# Patient Record
Sex: Male | Born: 1960 | Smoking: Current every day smoker
Health system: Southern US, Community
[De-identification: ages and names within clinical notes are randomized; demographics above are authoritative.]

## PROBLEM LIST (undated history)

## (undated) DIAGNOSIS — F1010 Alcohol abuse, uncomplicated: Secondary | ICD-10-CM

## (undated) HISTORY — PX: APPENDECTOMY: SHX54

---

## 2019-01-16 ENCOUNTER — Inpatient Hospital Stay
Admission: EM | Admit: 2019-01-16 | Discharge: 2019-01-18 | DRG: 101 | Disposition: A | Discharge status: Home / Self Care | Payer: Self-pay | Attending: Family Medicine | Admitting: Family Medicine

## 2019-01-16 ENCOUNTER — Other Ambulatory Visit: Payer: Self-pay

## 2019-01-16 ENCOUNTER — Encounter: Payer: Self-pay | Admitting: Emergency Medicine

## 2019-01-16 ENCOUNTER — Emergency Department: Payer: Self-pay

## 2019-01-16 DIAGNOSIS — M6282 Rhabdomyolysis: Secondary | ICD-10-CM | POA: Diagnosis present

## 2019-01-16 DIAGNOSIS — N179 Acute kidney failure, unspecified: Secondary | ICD-10-CM | POA: Diagnosis present

## 2019-01-16 DIAGNOSIS — K709 Alcoholic liver disease, unspecified: Secondary | ICD-10-CM | POA: Diagnosis present

## 2019-01-16 DIAGNOSIS — R569 Unspecified convulsions: Principal | ICD-10-CM | POA: Diagnosis present

## 2019-01-16 DIAGNOSIS — F17210 Nicotine dependence, cigarettes, uncomplicated: Secondary | ICD-10-CM | POA: Diagnosis present

## 2019-01-16 DIAGNOSIS — F10239 Alcohol dependence with withdrawal, unspecified: Secondary | ICD-10-CM | POA: Diagnosis present

## 2019-01-16 DIAGNOSIS — E8729 Other acidosis: Secondary | ICD-10-CM

## 2019-01-16 DIAGNOSIS — Z1159 Encounter for screening for other viral diseases: Secondary | ICD-10-CM

## 2019-01-16 DIAGNOSIS — D6959 Other secondary thrombocytopenia: Secondary | ICD-10-CM | POA: Diagnosis present

## 2019-01-16 DIAGNOSIS — F10232 Alcohol dependence with withdrawal with perceptual disturbance: Secondary | ICD-10-CM

## 2019-01-16 DIAGNOSIS — E876 Hypokalemia: Secondary | ICD-10-CM | POA: Diagnosis present

## 2019-01-16 DIAGNOSIS — Z6830 Body mass index (BMI) 30.0-30.9, adult: Secondary | ICD-10-CM

## 2019-01-16 DIAGNOSIS — E872 Acidosis: Secondary | ICD-10-CM | POA: Diagnosis present

## 2019-01-16 DIAGNOSIS — E669 Obesity, unspecified: Secondary | ICD-10-CM | POA: Diagnosis present

## 2019-01-16 HISTORY — DX: Alcohol abuse, uncomplicated: F10.10

## 2019-01-16 LAB — MAGNESIUM: Magnesium: 2.1 mg/dL (ref 1.7–2.4)

## 2019-01-16 LAB — COMPREHENSIVE METABOLIC PANEL
ALT: 113 U/L — ABNORMAL HIGH (ref 0–44)
AST: 176 U/L — ABNORMAL HIGH (ref 15–41)
Albumin: 4.1 g/dL (ref 3.5–5.0)
Alkaline Phosphatase: 117 U/L (ref 38–126)
Anion gap: 23 — ABNORMAL HIGH (ref 5–15)
BUN: 14 mg/dL (ref 6–20)
CO2: 12 mmol/L — ABNORMAL LOW (ref 22–32)
Calcium: 9 mg/dL (ref 8.9–10.3)
Chloride: 100 mmol/L (ref 98–111)
Creatinine, Ser: 1.56 mg/dL — ABNORMAL HIGH (ref 0.61–1.24)
GFR calc Af Amer: 57 mL/min — ABNORMAL LOW (ref >60)
GFR calc non Af Amer: 49 mL/min — ABNORMAL LOW (ref >60)
Glucose, Bld: 133 mg/dL — ABNORMAL HIGH (ref 70–99)
Potassium: 3.2 mmol/L — ABNORMAL LOW (ref 3.5–5.1)
Sodium: 135 mmol/L (ref 135–145)
Total Bilirubin: 4.3 mg/dL — ABNORMAL HIGH (ref 0.3–1.2)
Total Protein: 8 g/dL (ref 6.5–8.1)

## 2019-01-16 LAB — BLOOD GAS, VENOUS

## 2019-01-16 LAB — CBC
HCT: 41.7 % (ref 39.0–52.0)
Hemoglobin: 13.7 g/dL (ref 13.0–17.0)
MCH: 37 pg — ABNORMAL HIGH (ref 26.0–34.0)
MCHC: 32.9 g/dL (ref 30.0–36.0)
MCV: 112.7 fL — ABNORMAL HIGH (ref 80.0–100.0)
Platelets: 59 10*3/uL — ABNORMAL LOW (ref 150–400)
RBC: 3.7 MIL/uL — ABNORMAL LOW (ref 4.22–5.81)
RDW: 13.3 % (ref 11.5–15.5)
WBC: 16.3 10*3/uL — ABNORMAL HIGH (ref 4.0–10.5)
nRBC: 0 % (ref 0.0–0.2)

## 2019-01-16 LAB — LACTIC ACID, PLASMA: Lactic Acid, Venous: 1.5 mmol/L (ref 0.5–1.9)

## 2019-01-16 LAB — SARS CORONAVIRUS 2 BY RT PCR (HOSPITAL ORDER, PERFORMED IN ~~LOC~~ HOSPITAL LAB): SARS Coronavirus 2: NEGATIVE

## 2019-01-16 LAB — CK: Total CK: 1082 U/L — ABNORMAL HIGH (ref 49–397)

## 2019-01-16 MED ORDER — POTASSIUM CHLORIDE CRYS ER 20 MEQ PO TBCR
20.0000 meq | EXTENDED_RELEASE_TABLET | Freq: Two times a day (BID) | ORAL | Status: DC
  Administered 2019-01-16 – 2019-01-18 (×5): 20 meq via ORAL
  Filled 2019-01-16 (×5): qty 1

## 2019-01-16 MED ORDER — THIAMINE HCL 100 MG/ML IJ SOLN
Freq: Once | INTRAVENOUS | Status: AC
  Administered 2019-01-16: 12:00:00 via INTRAVENOUS
  Filled 2019-01-16: qty 1000

## 2019-01-16 MED ORDER — VITAMIN B-1 100 MG PO TABS
100.0000 mg | ORAL_TABLET | Freq: Every day | ORAL | Status: DC
  Administered 2019-01-16 – 2019-01-18 (×3): 100 mg via ORAL
  Filled 2019-01-16 (×3): qty 1

## 2019-01-16 MED ORDER — THIAMINE HCL 100 MG/ML IJ SOLN: 100.0000 mg | Freq: Every day | INTRAMUSCULAR | Status: DC

## 2019-01-16 MED ORDER — MAGNESIUM SULFATE IN D5W 1-5 GM/100ML-% IV SOLN
1.0000 g | Freq: Once | INTRAVENOUS | Status: DC
  Filled 2019-01-16: qty 100

## 2019-01-16 MED ORDER — DEXTROSE-NACL 5-0.9 % IV SOLN
INTRAVENOUS | Status: DC
  Administered 2019-01-16 – 2019-01-17 (×2): via INTRAVENOUS

## 2019-01-16 MED ORDER — NICOTINE 21 MG/24HR TD PT24
21.0000 mg | MEDICATED_PATCH | Freq: Every day | TRANSDERMAL | Status: DC
  Administered 2019-01-16 – 2019-01-17 (×2): 21 mg via TRANSDERMAL
  Filled 2019-01-16 (×2): qty 1

## 2019-01-16 MED ORDER — LORAZEPAM 2 MG/ML IJ SOLN
1.5000 mg | Freq: Once | INTRAMUSCULAR | Status: AC
  Administered 2019-01-16: 1.5 mg via INTRAVENOUS

## 2019-01-16 MED ORDER — ZOLPIDEM TARTRATE 5 MG PO TABS
5.0000 mg | ORAL_TABLET | Freq: Every evening | ORAL | Status: DC | PRN
  Administered 2019-01-16 – 2019-01-17 (×2): 5 mg via ORAL
  Filled 2019-01-16 (×2): qty 1

## 2019-01-16 MED ORDER — LORAZEPAM 2 MG/ML IJ SOLN
1.0000 mg | Freq: Once | INTRAMUSCULAR | Status: AC
  Administered 2019-01-16: 1 mg via INTRAVENOUS
  Filled 2019-01-16: qty 1

## 2019-01-16 MED ORDER — DOCUSATE SODIUM 100 MG PO CAPS: 100.0000 mg | ORAL_CAPSULE | Freq: Two times a day (BID) | ORAL | Status: DC | PRN

## 2019-01-16 MED ORDER — LORAZEPAM 1 MG PO TABS: 1.0000 mg | ORAL_TABLET | ORAL | Status: DC | PRN

## 2019-01-16 MED ORDER — LORAZEPAM 2 MG/ML IJ SOLN: 1.0000 mg | Freq: Four times a day (QID) | INTRAMUSCULAR | Status: DC | PRN

## 2019-01-16 MED ORDER — POTASSIUM CHLORIDE 10 MEQ/100ML IV SOLN
10.0000 meq | Freq: Once | INTRAVENOUS | Status: AC
  Administered 2019-01-16: 10 meq via INTRAVENOUS
  Filled 2019-01-16: qty 100

## 2019-01-16 MED ORDER — DEXTROSE-NACL 5-0.9 % IV SOLN
INTRAVENOUS | Status: DC
  Administered 2019-01-16: 14:00:00 via INTRAVENOUS

## 2019-01-16 MED ORDER — FOLIC ACID 1 MG PO TABS
1.0000 mg | ORAL_TABLET | Freq: Every day | ORAL | Status: DC
  Administered 2019-01-16 – 2019-01-18 (×3): 1 mg via ORAL
  Filled 2019-01-16 (×3): qty 1

## 2019-01-16 MED ORDER — HEPARIN SODIUM (PORCINE) 5000 UNIT/ML IJ SOLN
5000.0000 [IU] | Freq: Three times a day (TID) | INTRAMUSCULAR | Status: DC
  Administered 2019-01-16 – 2019-01-17 (×2): 5000 [IU] via SUBCUTANEOUS
  Filled 2019-01-16 (×2): qty 1

## 2019-01-16 MED ORDER — ADULT MULTIVITAMIN W/MINERALS CH
1.0000 | ORAL_TABLET | Freq: Every day | ORAL | Status: DC
  Administered 2019-01-16 – 2019-01-18 (×3): 1 via ORAL
  Filled 2019-01-16 (×3): qty 1

## 2019-01-16 MED ORDER — LORAZEPAM 2 MG/ML IJ SOLN
INTRAMUSCULAR | Status: AC
  Administered 2019-01-16: 11:00:00 1.5 mg via INTRAVENOUS
  Filled 2019-01-16: qty 1

## 2019-01-16 MED ORDER — LORAZEPAM 2 MG/ML IJ SOLN: 1.0000 mg | INTRAMUSCULAR | Status: DC | PRN

## 2019-01-16 MED ORDER — THIAMINE HCL 100 MG/ML IJ SOLN
100.0000 mg | Freq: Every day | INTRAMUSCULAR | Status: DC
  Filled 2019-01-16: qty 2

## 2019-01-16 MED ORDER — VITAMIN B-1 100 MG PO TABS
100.0000 mg | ORAL_TABLET | Freq: Every day | ORAL | Status: DC
  Administered 2019-01-16: 100 mg via ORAL
  Filled 2019-01-16: qty 1

## 2019-01-16 MED ORDER — LORAZEPAM 2 MG/ML IJ SOLN: 2.0000 mg | INTRAMUSCULAR | Status: DC | PRN

## 2019-01-16 MED ORDER — LORAZEPAM 1 MG PO TABS: 1.0000 mg | ORAL_TABLET | Freq: Four times a day (QID) | ORAL | Status: DC | PRN

## 2019-01-16 NOTE — H&P (Signed)
[employer] [facility] [place] [facility]   PATIENT NAME: Nicholas Durham    MR#:  [id number]  DATE OF BIRTH:  13-Jun-1961  DATE OF ADMISSION:  01/16/2019  PRIMARY CARE PHYSICIAN: System, Provider Not In   REQUESTING/REFERRING PHYSICIAN: Quale CHIEF COMPLAINT:   Chief Complaint  Patient presents with  . Seizures    HISTORY OF PRESENT ILLNESS: Nicholas Durham  is a 58 y.o. male with a known history of alcohol abuse, no known medical issues but he does not go to a physician regularly. Since he is staying home and not have much work for last 95-month due to COVID-19-he is drinking more alcohol every day.  Since last Friday (3 days ago) he decided he would not drink anymore and stopped.  Today at home he had one episode of seizures where his son saw him and brought to the emergency room.  While coming to ER he forgot his cell phone in the parking lot in car so went back and again had 2 more seizure episodes in the ER and parking lot. ER physician gave to admit to hospitalist service for alcohol withdrawal seizures.  Patient does not have history of seizures.  PAST MEDICAL HISTORY:   Past Medical History:  Diagnosis Date  . ETOH abuse     PAST SURGICAL HISTORY:  Past Surgical History:  Procedure Laterality Date  . APPENDECTOMY      SOCIAL HISTORY:  Social History   Tobacco Use  . Smoking status: Current Every Day Smoker    Packs/day: 0.50    Types: Cigarettes  . Smokeless tobacco: Never Used  Substance Use Topics  . Alcohol use: Yes    Comment: Stopped drinking 01/12/19    FAMILY HISTORY: History reviewed. No pertinent family history.  DRUG ALLERGIES: No Known Allergies  REVIEW OF SYSTEMS:   CONSTITUTIONAL: No fever, fatigue or weakness.  EYES: No blurred or double vision.  EARS, NOSE, AND THROAT: No tinnitus or ear pain.  RESPIRATORY: No cough, shortness of breath, wheezing or hemoptysis.  CARDIOVASCULAR: No chest pain, orthopnea, edema.  GASTROINTESTINAL: No  nausea, vomiting, diarrhea or abdominal pain.  GENITOURINARY: No dysuria, hematuria.  ENDOCRINE: No polyuria, nocturia,  HEMATOLOGY: No anemia, easy bruising or bleeding SKIN: No rash or lesion. MUSCULOSKELETAL: No joint pain or arthritis.   NEUROLOGIC: No tingling, numbness, weakness.  PSYCHIATRY: No anxiety or depression.   MEDICATIONS AT HOME:  Prior to Admission medications   Medication Sig Start Date End Date Taking? Authorizing Provider  Ibuprofen-diphenhydrAMINE HCl 200-25 MG CAPS Take 1-2 capsules by mouth at bedtime as needed (sleep).   Yes [provider]      PHYSICAL EXAMINATION:   VITAL SIGNS: Blood pressure (!) 150/89, pulse (!) 106, temperature 98.7 F (37.1 C), temperature source Oral, resp. rate 17, height  (1.854 m), weight 104.3 kg, SpO2 100 %.  GENERAL:  58 y.o.-year-old patient lying in the bed with no acute distress.  EYES: Pupils equal, round, reactive to light and accommodation. No scleral icterus. Extraocular muscles intact.  HEENT: Head atraumatic, normocephalic. Oropharynx and nasopharynx clear.  NECK:  Supple, no jugular venous distention. No thyroid enlargement, no tenderness.  LUNGS: Normal breath sounds bilaterally, no wheezing, rales,rhonchi or crepitation. No use of accessory muscles of respiration.  CARDIOVASCULAR: S1, S2 normal. No murmurs, rubs, or gallops.  ABDOMEN: Soft, nontender, nondistended. Bowel sounds present. No organomegaly or mass.  EXTREMITIES: No pedal edema, cyanosis, or clubbing.  Some bleeding from right elbow due to superficial injury.  NEUROLOGIC: Cranial nerves II through XII are intact. Muscle strength 5/5 in all extremities. Sensation intact. Gait not checked.  PSYCHIATRIC: The patient is alert and oriented x 3.  SKIN: No obvious rash, lesion, or ulcer.   LABORATORY PANEL:   CBC Recent Labs  Lab 01/16/19 1113  WBC 16.3*  HGB 13.7  HCT 41.7  PLT 59*  MCV 112.7*  MCH 37.0*  MCHC 32.9  RDW 13.3    ------------------------------------------------------------------------------------------------------------------  Chemistries  Recent Labs  Lab 01/16/19 1113 01/16/19 1356  NA 135  --   K 3.2*  --   CL 100  --   CO2 12*  --   GLUCOSE 133*  --   BUN 14  --   CREATININE 1.56*  --   CALCIUM 9.0  --   MG  --  2.1  AST 176*  --   ALT 113*  --   ALKPHOS 117  --   BILITOT 4.3*  --    ------------------------------------------------------------------------------------------------------------------ estimated creatinine clearance is 67.1 mL/min (A) (by C-G formula based on SCr of 1.56 mg/dL (H)). ------------------------------------------------------------------------------------------------------------------ No results for input(s): TSH, T4TOTAL, T3FREE, THYROIDAB in the last 72 hours.  Invalid input(s): FREET3   Coagulation profile No results for input(s): INR, PROTIME in the last 168 hours. ------------------------------------------------------------------------------------------------------------------- No results for input(s): DDIMER in the last 72 hours. -------------------------------------------------------------------------------------------------------------------  Cardiac Enzymes No results for input(s): CKMB, TROPONINI, MYOGLOBIN in the last 168 hours.  Invalid input(s): CK ------------------------------------------------------------------------------------------------------------------ Invalid input(s): POCBNP  ---------------------------------------------------------------------------------------------------------------  Urinalysis No results found for: COLORURINE, APPEARANCEUR, LABSPEC, PHURINE, GLUCOSEU, HGBUR, BILIRUBINUR, KETONESUR, PROTEINUR, UROBILINOGEN, NITRITE, LEUKOCYTESUR   RADIOLOGY: Ct Head Wo Contrast  Result Date: 01/16/2019 CLINICAL DATA:  Seizure today, fall and hit head. EXAM: CT HEAD WITHOUT CONTRAST CT CERVICAL SPINE WITHOUT CONTRAST  TECHNIQUE: Multidetector CT imaging of the head and cervical spine was performed following the standard protocol without intravenous contrast. Multiplanar CT image reconstructions of the cervical spine were also generated. COMPARISON:  None. FINDINGS: CT HEAD FINDINGS Brain: Mild generalized parenchymal volume loss with commensurate dilatation of the ventricles and sulci. Mild chronic small vessel ischemic changes within the deep periventricular white matter regions bilaterally. No mass, hemorrhage, edema or other evidence of acute parenchymal abnormality. No extra-axial hemorrhage. Vascular: No hyperdense vessel or unexpected calcification. Skull: Normal. Negative for fracture or focal lesion. Sinuses/Orbits: No acute finding. Other: None. CT CERVICAL SPINE FINDINGS Alignment: Mild dextroscoliosis. Slight reversal of the normal cervical spine lordosis which is likely related to degenerative spondylosis in the lower cervical spine. No evidence of acute vertebral body subluxation. Skull base and vertebrae: No fracture line or displaced fracture fragment seen. Facet joints are normally aligned throughout. Soft tissues and spinal canal: No prevertebral fluid or swelling. No visible canal hematoma. Disc levels: Degenerative spondylosis at the C5-6 and C6-7 levels, moderate in degree with associated disc space narrowings and osseous spurring. Associated disc-osteophytic bulges causing mild to moderate central canal stenoses. Additional degenerative hypertrophy of the uncovertebral and facet joints causing moderate bilateral neural foramen stenoses at C5-6 and C6-7 with possible associated nerve root impingement. Upper chest: Negative. Other: Bilateral carotid atherosclerosis. IMPRESSION: 1. No acute intracranial abnormality. No intracranial mass, hemorrhage or edema. No skull fracture. Mild chronic small vessel ischemic changes in the white matter. 2. No fracture or acute subluxation within the cervical spine.  Degenerative changes of the lower cervical spine, as detailed above. 3. Carotid atherosclerosis. Electronically Signed   By: Bary RichardStan  Maynard M.D.   On: 01/16/2019 11:38  Ct Cervical Spine Wo Contrast  Result Date: 01/16/2019 CLINICAL DATA:  Seizure today, fall and hit head. EXAM: CT HEAD WITHOUT CONTRAST CT CERVICAL SPINE WITHOUT CONTRAST TECHNIQUE: Multidetector CT imaging of the head and cervical spine was performed following the standard protocol without intravenous contrast. Multiplanar CT image reconstructions of the cervical spine were also generated. COMPARISON:  None. FINDINGS: CT HEAD FINDINGS Brain: Mild generalized parenchymal volume loss with commensurate dilatation of the ventricles and sulci. Mild chronic small vessel ischemic changes within the deep periventricular white matter regions bilaterally. No mass, hemorrhage, edema or other evidence of acute parenchymal abnormality. No extra-axial hemorrhage. Vascular: No hyperdense vessel or unexpected calcification. Skull: Normal. Negative for fracture or focal lesion. Sinuses/Orbits: No acute finding. Other: None. CT CERVICAL SPINE FINDINGS Alignment: Mild dextroscoliosis. Slight reversal of the normal cervical spine lordosis which is likely related to degenerative spondylosis in the lower cervical spine. No evidence of acute vertebral body subluxation. Skull base and vertebrae: No fracture line or displaced fracture fragment seen. Facet joints are normally aligned throughout. Soft tissues and spinal canal: No prevertebral fluid or swelling. No visible canal hematoma. Disc levels: Degenerative spondylosis at the C5-6 and C6-7 levels, moderate in degree with associated disc space narrowings and osseous spurring. Associated disc-osteophytic bulges causing mild to moderate central canal stenoses. Additional degenerative hypertrophy of the uncovertebral and facet joints causing moderate bilateral neural foramen stenoses at C5-6 and C6-7 with possible  associated nerve root impingement. Upper chest: Negative. Other: Bilateral carotid atherosclerosis. IMPRESSION: 1. No acute intracranial abnormality. No intracranial mass, hemorrhage or edema. No skull fracture. Mild chronic small vessel ischemic changes in the white matter. 2. No fracture or acute subluxation within the cervical spine. Degenerative changes of the lower cervical spine, as detailed above. 3. Carotid atherosclerosis. Electronically Signed   By: Bary Richard M.D.   On: 01/16/2019 11:38    EKG: Orders placed or performed during the hospital encounter of 01/16/19  . ED EKG  . ED EKG  . EKG 12-Lead  . EKG 12-Lead    IMPRESSION AND PLAN:  *Alcohol withdrawal seizure We will keep on CIWA protocol and give Ativan as needed for seizures also. As he does not have history of seizure, he does not need antiseizure medication.  *Rhabdomyolysis Continue IV fluid and follow CK level tomorrow.  *Hypokalemia Replace oral.  *Alcoholic ketosis Dextrose drip was started by ER physician.  *Acute renal insufficiency We do not have baseline creatinine, patient does not follow regularly with physician, with IV fluid I would like to monitor tomorrow.  *Elevated liver function enzymes Patient is a chronic alcohol drinker, recheck tomorrow for correction.  *Thrombocytopenia Due to chronic alcoholism, monitor.  *Active smoking Counseled to quit smoking for 4 minutes and offered nicotine patch.  All the records are reviewed and case discussed with ED provider. Management plans discussed with the patient, family and they are in agreement.  CODE STATUS: full.    Code Status Orders  (From admission, onward)         Start     Ordered   01/16/19 1559  Full code  Continuous     01/16/19 1559        Code Status History    This patient has a current code status but no historical code status.    Patient's son was present in the room during my visit.   TOTAL TIME TAKING CARE OF  THIS PATIENT: 45 minutes.    Altamese Dilling M.D on 01/16/2019  Between 7am to 6pm - Pager - [phone]  After 6pm go to [email or web address] - password [email or web address]  [facility] [place] [facility]  Office  [phone]  CC: Primary care physician; System, Provider Not In   Note: This dictation was prepared with Dragon dictation along with smaller phrase technology. Any transcriptional errors that result from this process are unintentional.

## 2019-01-16 NOTE — ED Notes (Signed)
Pt is refusing covid swab. EDP made aware.

## 2019-01-16 NOTE — Progress Notes (Signed)
   01/16/19 1920  Clinical Encounter Type  Visited With Patient  Visit Type Initial  Referral From Nurse  Consult/Referral To Chaplain  Spiritual Encounters  Spiritual Needs Prayer;Emotional  PT was alert and awake upon arrival. Watching television. Patient was talkative and open. CH provided pastoral care by allowing patient to vent, active/reflective listening and was a hopeful presence. Also prayed with patient as requested. Desires follow up visits.

## 2019-01-16 NOTE — ED Triage Notes (Signed)
Pt to ED after seizure today.  Pt here with son, was here for work, son states had seizure today fell and hit head on concrete, has abrasion to right elbow.  Per son pt had 2 seizures today at home as well.  Pt quit drinking Thursday from long history of daily drinking, no hx of seizure disorders.  Seizure lasted a couple minutes.  No incontinence noted.

## 2019-01-16 NOTE — ED Provider Notes (Signed)
[facility] Emergency Department Provider Note   ____________________________________________   First MD Initiated Contact with Patient 01/16/19 1103     (approximate)  I have reviewed the triage vital signs and the nursing notes.   HISTORY  Chief Complaint Seizures  Majority of history is provided by the patient's son who is present with him throughout the day today and works with him  HPI Nicholas Durham is a 59 y.o. male with no major medical history   Patient had 2 seizures earlier today, prior to going to work.  These was witnessed by his son and he recalls 1 of them.  His son reports they think that he is going through alcohol withdrawal as he was a very heavy drinker but quit drinking on Thursday.  He had 2 witnessed seizures at home but still wanted to go to work today, they came to work and while at the hospital he had another seizure.  He will become shaky, falls, and was twitching for several minutes.  He came to confused, and when I saw him he was also confused  Past Medical History:  Diagnosis Date  . ETOH abuse     Patient Active Problem List   Diagnosis Date Noted  . 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 APPENDECTOMY      Prior to Admission medications   Medication Sig Start Date End Date Taking? Authorizing Provider  Ibuprofen-diphenhydrAMINE HCl 200-25 MG CAPS Take 1-2 capsules by mouth at bedtime as needed (sleep).   Yes [provider]    Allergies Patient has no known allergies.  History reviewed. No pertinent family history.  Social History Social History   Tobacco Use  . Smoking status: Current Every Day Smoker    Packs/day: 0.50    Types: Cigarettes  . Smokeless tobacco: Never Used  Substance Use Topics  . Alcohol use: Yes    Comment: Stopped drinking 01/12/19  . Drug use: Yes    Types:  Marijuana    Review of Systems Son reports that he has been in his normal health without fevers chills or recent illness except he started developing shakes and alcohol withdrawals a couple days ago and then had 2 seizures prior to having a seizure here today.  He has no history of seizure disorder.  Is been in his normal health except he abruptly stopped heavy drinking    ____________________________________________   PHYSICAL EXAM:  VITAL SIGNS: ED Triage Vitals  Enc Vitals Group     BP 01/16/19 1106 140/84     Pulse Rate 01/16/19 1106 (!) 119     Resp 01/16/19 1106 19     Temp 01/16/19 1106 99 F (37.2 C)     Temp Source 01/16/19 1106 Oral     SpO2 01/16/19 1106 98 %     Weight 01/16/19 1107 230 lb (104.3 kg)     Height 01/16/19 1107 6\' 1"  (1.854 m)     Head Circumference --      Peak Flow --      Pain Score 01/16/19 1107 0     Pain Loc --      Pain Edu? --      Excl. in GC? --      Constitutional: Alert and disoriented.  Patient knows his name recognize his son but does not know what happened to him just now. Eyes: Conjunctivae are  active bilateral. Head: Atraumatic. Nose: No congestion/rhinnorhea. Mouth/Throat: Mucous membranes are moist. Neck: No stridor.  No neck pain or tenderness. Cardiovascular: Normal rate, regular rhythm. Grossly normal heart sounds.  Good peripheral circulation. Respiratory: Normal respiratory effort.  No retractions. Lungs CTAB. Gastrointestinal: Soft and nontender. No distention. Musculoskeletal: No lower extremity tenderness nor edema.  Patient has superficial scrapes and slight abrasions on both elbows. Neurologic: Asking questions like where MI, recognizes son.  He has clear speech.  He recalls having a seizure once earlier today before coming here but does not know what happened and does not recall having a second seizure at home. Skin:  Skin is warm, dry and intact. No rash noted. Psychiatric: Mood and affect are calm, but slightly  disoriented.  No desire to hurt himself or anyone else.  Reports he lost his wife 10 years ago, and that it contributed to his heavy alcohol and he decided to quit Thursday  ____________________________________________   LABS (all labs ordered are listed, but only abnormal results are displayed)  Labs Reviewed  CBC - Abnormal; Notable for the following components:      Result Value   WBC 16.3 (*)    RBC 3.70 (*)    MCV 112.7 (*)    MCH 37.0 (*)    Platelets 59 (*)    All other components within normal limits  COMPREHENSIVE METABOLIC PANEL - Abnormal; Notable for the following components:   Potassium 3.2 (*)    CO2 12 (*)    Glucose, Bld 133 (*)    Creatinine, Ser 1.56 (*)    AST 176 (*)    ALT 113 (*)    Total Bilirubin 4.3 (*)    GFR calc non Af Amer 49 (*)    GFR calc Af Amer 57 (*)    Anion gap 23 (*)    All other components within normal limits  BLOOD GAS, VENOUS - Abnormal; Notable for the following components:   pCO2, Ven 39 (*)    All other components within normal limits  CK - Abnormal; Notable for the following components:   Total CK 1,082 (*)    All other components within normal limits  SARS CORONAVIRUS 2 (HOSPITAL ORDER, PERFORMED IN [facility])  LACTIC ACID, PLASMA  MAGNESIUM  CBG MONITORING, ED   ____________________________________________  EKG  EKG is reviewed entered by me at 11 AM Heart rate 120 QRS 110 QTc 500 Somewhat hard to discern, suspect sinus tachycardia with occasional variation, cannot easily exclude A. fib.  ____________________________________________  RADIOLOGY  CT head neck negative for acute finding ____________________________________________   PROCEDURES  Procedure(s) performed: None  Procedures  Critical Care performed: Yes, see critical care note(s)  CRITICAL CARE Performed by: Sharyn CreamerMark Sienna Stonehocker   Total critical care time: 30 minutes  Critical care time was exclusive of separately billable procedures and  treating other patients.  Critical care was necessary to treat or prevent imminent or life-threatening deterioration.  Critical care was time spent personally by me on the following activities: development of treatment plan with patient and/or surrogate as well as nursing, discussions with consultants, evaluation of patient's response to treatment, examination of patient, obtaining history from patient or surrogate, ordering and performing treatments and interventions, ordering and review of laboratory studies, ordering and review of radiographic studies, pulse oximetry and re-evaluation of patient's condition.  Patient with seizure, notable metabolic derangement including significant metabolic abnormalities including low bicarbonate, anion gap acidosis.  Patient initially responded to is a "CODE  BLUE" in the hospital entrance.  Patient required critical care clinic treatment with IV Ativan, CT scan, neurology consultation, admission to the hospital for further work-up. ____________________________________________   INITIAL IMPRESSION / ASSESSMENT AND PLAN / ED COURSE  Pertinent labs & imaging results that were available during my care of the patient were reviewed by me and considered in my medical decision making (see chart for details).   Seizures, witnessed by son and also as part of a CODE BLUE response.  He was postictal but seems to be clearing, now at 11:15 AM he is improving.  He has been experiencing alcohol withdrawal symptoms and given his history of abrupt stopping alcohol and no prior seizure disorder I suspect this is alcohol withdrawal seizures.  Give IV Ativan, also placed on withdrawal protocol.  Head CT electrolytes, seizure precautions.  No cardiac pulmonary acute vascular infectious or other complaints.    Clinical Course as of Jan 15 1502  Mon Jan 16, 2019  1159 The patient is improving.  He is alert and oriented.  Slightly tachycardic still and slightly tremulous.  Agreeable  to having COVID swab and also understands need for hospitalization.  We will give slight additional dose of Ativan at this time, awaiting electrolytes.   [MQ]  1215 Dr. Loretha Brasil seeing patient at this time for neuro consult.    [MQ]    Clinical Course User Index [MQ] Sharyn Creamer, MD   ----------------------------------------- 1:12 PM on 01/16/2019 -----------------------------------------  Patient lab work note reviewed, notable acidosis with elevated anion gap.  He is not a diabetic and his glucose does not appear consistent with DKA.  Lactic acidosis, diabetic alcoholic ketoacidosis strongly considered, will provide magnesium, dextrose-containing solution, check CK, lactic acid, and patient currently awaiting admission to the hospitalist service.  ____________________________________________   FINAL CLINICAL IMPRESSION(S) / ED DIAGNOSES  Final diagnoses:  Alcohol related seizure (HCC)  Non-traumatic rhabdomyolysis  High anion gap metabolic acidosis        Note:  This document was prepared using Dragon voice recognition software and may include unintentional dictation errors       Sharyn Creamer, MD 01/16/19 1504

## 2019-01-16 NOTE — ED Notes (Addendum)
ED TO INPATIENT HANDOFF REPORT  ED Nurse Name and Phone #: Geraldine Contras 2  S Name/Age/Gender Nicholas Durham 58 y.o. male Room/Bed: ED24A/ED24A  Code Status   Code Status: Not on file  Home/SNF/Other Home Patient oriented to: self, place, time and situation Is this baseline? Yes   Triage Complete: Triage complete  Chief Complaint Seizure   Triage Note Pt to ED after seizure today.  Pt here with son, was here for work, son states had seizure today fell and hit head on concrete, has abrasion to right elbow.  Per son pt had 2 seizures today at home as well.  Pt quit drinking Thursday from long history of daily drinking, no hx of seizure disorders.  Seizure lasted a couple minutes.  No incontinence noted.   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 ETOH abuse    Past Surgical History:  Procedure Laterality Date  . APPENDECTOMY       A IV Location/Drains/Wounds Patient Lines/Drains/Airways Status   Active Line/Drains/Airways    Name:   Placement date:   Placement time:   Site:   Days:   Peripheral IV 01/16/19 Left Antecubital   01/16/19    1106    Antecubital   less than 1   Peripheral IV 01/16/19 Right Antecubital   01/16/19    1415    Antecubital   less than 1           Intake/Output Last 24 hours No intake or output data in the 24 hours ending 01/16/19 1416  Labs/Imaging Results for orders placed or performed during the hospital encounter of 01/16/19 (from the past 48 hour(s))  CBC     Status: Abnormal   Collection Time: 01/16/19 11:13 AM  Result Value Ref Range   WBC 16.3 (H) 4.0 - 10.5 K/uL   RBC 3.70 (L) 4.22 - 5.81 MIL/uL   Hemoglobin 13.7 13.0 - 17.0 g/dL   HCT 95.6 21.3 - 08.6 %   MCV 112.7 (H) 80.0 - 100.0 fL   MCH 37.0 (H) 26.0 - 34.0 pg   MCHC 32.9 30.0 - 36.0 g/dL   RDW 57.8 46.9 - 62.9 %   Platelets 59 (L) 150 - 400 K/uL    Comment: Immature Platelet Fraction may be clinically indicated, consider ordering this additional test [id number]    nRBC 0.0 0.0 - 0.2 %    Comment: Performed at [facility], [address] [place], [place] [address]  Comprehensive metabolic panel     Status: Abnormal   Collection Time: 01/16/19 11:13 AM  Result Value Ref Range   Sodium 135 135 - 145 mmol/L   Potassium 3.2 (L) 3.5 - 5.1 mmol/L   Chloride 100 98 - 111 mmol/L   CO2 12 (L) 22 - 32 mmol/L   Glucose, Bld 133 (H) 70 -  99 mg/dL   BUN 14 6 - 20 mg/dL   Creatinine, Ser 4.09 (H) 0.61 - 1.24 mg/dL   Calcium 9.0 8.9 - 81.1 mg/dL   Total Protein 8.0 6.5 - 8.1 g/dL   Albumin 4.1 3.5 - 5.0 g/dL   AST 914 (H) 15 - 41 U/L    Comment: RESULT CONFIRMED BY MANUAL DILUTION AKT   ALT 113 (H) 0 - 44 U/L    Comment: RESULT CONFIRMED BY MANUAL DILUTION AKT   Alkaline Phosphatase 117 38 - 126 U/L   Total Bilirubin 4.3 (H) 0.3 - 1.2 mg/dL   GFR calc non Af Amer 49 (L) >60 mL/min   GFR calc Af Amer 57 (L) >60 mL/min   Anion gap 23 (H) 5 - 15    Comment: Performed at [facility], [address] [place], [place] [address]   Ct Head Wo Contrast  Result Date: 01/16/2019 CLINICAL DATA:  Seizure today, fall and hit head. EXAM: CT HEAD WITHOUT CONTRAST CT CERVICAL SPINE WITHOUT CONTRAST TECHNIQUE: Multidetector CT imaging of the head  and cervical spine was performed following the standard protocol without intravenous contrast. Multiplanar CT image reconstructions of the cervical spine were also generated. COMPARISON:  None. FINDINGS: CT HEAD FINDINGS Brain: Mild generalized parenchymal volume loss with commensurate dilatation of the ventricles and sulci. Mild chronic small vessel ischemic changes within the deep periventricular white matter regions bilaterally. No mass, hemorrhage, edema or other evidence of acute parenchymal abnormality. No extra-axial hemorrhage. Vascular: No hyperdense vessel or unexpected calcification. Skull: Normal. Negative for fracture or focal lesion. Sinuses/Orbits: No acute finding. Other: None. CT CERVICAL SPINE FINDINGS Alignment: Mild dextroscoliosis. Slight reversal of the normal cervical spine lordosis which is likely related to degenerative spondylosis in the lower cervical spine. No evidence of acute vertebral body subluxation. Skull base and vertebrae: No fracture line or displaced fracture fragment seen. Facet joints are normally aligned throughout. Soft tissues and spinal canal: No prevertebral fluid or swelling. No visible canal hematoma. Disc levels: Degenerative spondylosis at the C5-6 and C6-7 levels, moderate in degree with associated disc space narrowings and osseous spurring. Associated disc-osteophytic bulges causing mild to moderate central canal stenoses. Additional degenerative hypertrophy of the uncovertebral and facet joints causing moderate bilateral neural foramen stenoses at C5-6 and C6-7 with possible associated nerve root impingement. Upper chest: Negative. Other: Bilateral carotid atherosclerosis. IMPRESSION: 1. No acute intracranial abnormality. No intracranial mass, hemorrhage or edema. No skull fracture. Mild chronic small vessel ischemic changes in the white matter. 2. No fracture or acute subluxation within the cervical spine. Degenerative changes of the lower cervical spine, as  detailed above. 3. Carotid atherosclerosis. Electronically Signed   By: Bary Richard M.D.   On: 01/16/2019 11:38   Ct Cervical Spine Wo Contrast  Result Date: 01/16/2019 CLINICAL DATA:  Seizure today, fall and hit head. EXAM: CT HEAD WITHOUT CONTRAST CT CERVICAL SPINE WITHOUT CONTRAST TECHNIQUE: Multidetector CT imaging of the head and cervical spine was performed following the standard protocol without intravenous contrast. Multiplanar CT image reconstructions of the cervical spine were also generated. COMPARISON:  None. FINDINGS: CT HEAD FINDINGS Brain: Mild generalized parenchymal volume loss with commensurate dilatation of the ventricles and sulci. Mild chronic small vessel ischemic changes within the deep periventricular white matter regions bilaterally. No mass, hemorrhage, edema or other evidence of acute parenchymal abnormality. No extra-axial hemorrhage. Vascular: No hyperdense vessel or unexpected calcification. Skull: Normal. Negative for fracture or focal lesion. Sinuses/Orbits: No acute finding. Other: None.  CT CERVICAL SPINE FINDINGS Alignment: Mild dextroscoliosis. Slight reversal of the normal cervical spine lordosis which is likely related to degenerative spondylosis in the lower cervical spine. No evidence of acute vertebral body subluxation. Skull base and vertebrae: No fracture line or displaced fracture fragment seen. Facet joints are normally aligned throughout. Soft tissues and spinal canal: No prevertebral fluid or swelling. No visible canal hematoma. Disc levels: Degenerative spondylosis at the C5-6 and C6-7 levels, moderate in degree with associated disc space narrowings and osseous spurring. Associated disc-osteophytic bulges causing mild to moderate central canal stenoses. Additional degenerative hypertrophy of the uncovertebral and facet joints causing moderate bilateral neural foramen stenoses at C5-6 and C6-7 with possible associated nerve root impingement. Upper chest: Negative.  Other: Bilateral carotid atherosclerosis. IMPRESSION: 1. No acute intracranial abnormality. No intracranial mass, hemorrhage or edema. No skull fracture. Mild chronic small vessel ischemic changes in the white matter. 2. No fracture or acute subluxation within the cervical spine. Degenerative changes of the lower cervical spine, as detailed above. 3. Carotid atherosclerosis. Electronically Signed   By: Bary Richard M.D.   On: 01/16/2019 11:38    Pending Labs Unresulted Labs (From admission, onward)    Start     Ordered   01/17/19 0500  Comprehensive metabolic panel  Tomorrow morning,   STAT     01/16/19 1358   01/16/19 1345  SARS Coronavirus 2 [facility] order, Performed in [facility] hospital lab)  Once,   STAT     01/16/19 1345   01/16/19 1312  Lactic acid, plasma  ONCE - STAT,   STAT     01/16/19 1311   01/16/19 1312  Blood gas, venous  ONCE - STAT,   STAT     01/16/19 1311   01/16/19 1312  CK  ONCE - STAT,   STAT     01/16/19 1311   01/16/19 1312  Magnesium  Add-on,   AD     01/16/19 1311   Signed and Held  HIV antibody (Routine Testing)  Once,   R     Signed and Held   Signed and Held  Basic metabolic panel  Tomorrow morning,   R     Signed and Held   Signed and Held  CBC  Tomorrow morning,   R     Signed and Held   Signed and Held  CBC  (heparin)  Once,   R    Comments:  Baseline for heparin therapy IF NOT ALREADY DRAWN.  Notify MD if PLT < 100 K.    Signed and Held   Signed and Held  Creatinine, serum  (heparin)  Once,   R    Comments:  Baseline for heparin therapy IF NOT ALREADY DRAWN.    Signed and Held          Vitals/Pain Today's Vitals   01/16/19 1330 01/16/19 1345 01/16/19 1404 01/16/19 1405  BP: 111/69  (!) 125/111   Pulse:  (!) 111  (!) 107  Resp: 19 (!) 26 (!) 22 16  Temp:      TempSrc:      SpO2:  96%  98%  Weight:      Height:      PainSc:        Isolation Precautions No active isolations  Medications Medications  LORazepam (ATIVAN) tablet 1  mg (has no administration in time range)    Or  LORazepam (ATIVAN) injection 1 mg (has no administration in time range)  dextrose 5 %-0.9 %  sodium chloride infusion ( Intravenous New Bag/Given 01/16/19 1403)  magnesium sulfate IVPB 1 g 100 mL (has no administration in time range)  potassium chloride 10 mEq in 100 mL IVPB (has no administration in time range)  LORazepam (ATIVAN) tablet 1 mg (has no administration in time range)    Or  LORazepam (ATIVAN) injection 1 mg (has no administration in time range)  thiamine (VITAMIN B-1) tablet 100 mg (has no administration in time range)    Or  thiamine (B-1) injection 100 mg (has no administration in time range)  folic acid (FOLVITE) tablet 1 mg (has no administration in time range)  multivitamin with minerals tablet 1 tablet (has no administration in time range)  LORazepam (ATIVAN) injection 2 mg (has no administration in time range)  potassium chloride SA (K-DUR) CR tablet 20 mEq (has no administration in time range)  nicotine (NICODERM CQ - dosed in mg/24 hours) patch 21 mg (has no administration in time range)  LORazepam (ATIVAN) injection 1.5 mg (1.5 mg Intravenous Given 01/16/19 1111)  sodium chloride 0.9 % 1,000 mL with thiamine 100 mg, folic acid 1 mg, multivitamins adult 10 mL infusion ( Intravenous New Bag/Given 01/16/19 1144)  LORazepam (ATIVAN) injection 1 mg (1 mg Intravenous Given 01/16/19 1211)    Mobility walks Moderate fall risk   Focused Assessments    R Recommendations: See Admitting Provider Note  Report given to: Kirt BoysMolly, RN

## 2019-01-17 ENCOUNTER — Inpatient Hospital Stay (HOSPITAL_COMMUNITY)
Admit: 2019-01-17 | Discharge: 2019-01-17 | Disposition: A | Discharge status: Home / Self Care | Payer: Self-pay | Attending: Nurse Practitioner | Admitting: Nurse Practitioner

## 2019-01-17 DIAGNOSIS — I4891 Unspecified atrial fibrillation: Secondary | ICD-10-CM

## 2019-01-17 LAB — COMPREHENSIVE METABOLIC PANEL
ALT: 78 U/L — ABNORMAL HIGH (ref 0–44)
AST: 115 U/L — ABNORMAL HIGH (ref 15–41)
Albumin: 3 g/dL — ABNORMAL LOW (ref 3.5–5.0)
Alkaline Phosphatase: 96 U/L (ref 38–126)
Anion gap: 8 (ref 5–15)
BUN: 15 mg/dL (ref 6–20)
CO2: 23 mmol/L (ref 22–32)
Calcium: 8.2 mg/dL — ABNORMAL LOW (ref 8.9–10.3)
Chloride: 109 mmol/L (ref 98–111)
Creatinine, Ser: 0.93 mg/dL (ref 0.61–1.24)
GFR calc Af Amer: >60 mL/min (ref >60)
GFR calc non Af Amer: >60 mL/min (ref >60)
Glucose, Bld: 110 mg/dL — ABNORMAL HIGH (ref 70–99)
Potassium: 3.2 mmol/L — ABNORMAL LOW (ref 3.5–5.1)
Sodium: 140 mmol/L (ref 135–145)
Total Bilirubin: 2.4 mg/dL — ABNORMAL HIGH (ref 0.3–1.2)
Total Protein: 5.9 g/dL — ABNORMAL LOW (ref 6.5–8.1)

## 2019-01-17 LAB — ECHOCARDIOGRAM COMPLETE
Height: 73 in
Weight: 3680 oz

## 2019-01-17 LAB — CK: Total CK: 954 U/L — ABNORMAL HIGH (ref 49–397)

## 2019-01-17 LAB — CBC
HCT: 32.6 % — ABNORMAL LOW (ref 39.0–52.0)
Hemoglobin: 10.7 g/dL — ABNORMAL LOW (ref 13.0–17.0)
MCH: 36.8 pg — ABNORMAL HIGH (ref 26.0–34.0)
MCHC: 32.8 g/dL (ref 30.0–36.0)
MCV: 112 fL — ABNORMAL HIGH (ref 80.0–100.0)
Platelets: 33 10*3/uL — ABNORMAL LOW (ref 150–400)
RBC: 2.91 MIL/uL — ABNORMAL LOW (ref 4.22–5.81)
RDW: 13.2 % (ref 11.5–15.5)
WBC: 5.5 10*3/uL (ref 4.0–10.5)
nRBC: 0 % (ref 0.0–0.2)

## 2019-01-17 LAB — TROPONIN I: Troponin I: <0.03 ng/mL (ref <0.03)

## 2019-01-17 LAB — MAGNESIUM: Magnesium: 2 mg/dL (ref 1.7–2.4)

## 2019-01-17 MED ORDER — METOPROLOL TARTRATE 5 MG/5ML IV SOLN
5.0000 mg | Freq: Once | INTRAVENOUS | Status: AC
  Administered 2019-01-17: 5 mg via INTRAVENOUS
  Filled 2019-01-17: qty 5

## 2019-01-17 MED ORDER — POTASSIUM CHLORIDE 20 MEQ PO PACK
40.0000 meq | PACK | Freq: Once | ORAL | Status: AC
  Administered 2019-01-17: 40 meq via ORAL
  Filled 2019-01-17: qty 2

## 2019-01-17 NOTE — Progress Notes (Signed)
[employer] [facility] [place] [facility]   PATIENT NAME: Nicholas Durham    MR#:  [id number]  DATE OF BIRTH:  12/11/1960  SUBJECTIVE:  CHIEF COMPLAINT:   Chief Complaint  Patient presents with  . Seizures   Brought with seizures after trying to stop drinking alcohol which he was drinking chronically and heavy. Feels much better today.  No complaints. REVIEW OF SYSTEMS:  CONSTITUTIONAL: No fever, fatigue or weakness.  EYES: No blurred or double vision.  EARS, NOSE, AND THROAT: No tinnitus or ear pain.  RESPIRATORY: No cough, shortness of breath, wheezing or hemoptysis.  CARDIOVASCULAR: No chest pain, orthopnea, edema.  GASTROINTESTINAL: No nausea, vomiting, diarrhea or abdominal pain.  GENITOURINARY: No dysuria, hematuria.  ENDOCRINE: No polyuria, nocturia,  HEMATOLOGY: No anemia, easy bruising or bleeding SKIN: No rash or lesion. MUSCULOSKELETAL: No joint pain or arthritis.   NEUROLOGIC: No tingling, numbness, weakness.  PSYCHIATRY: No anxiety or depression.   ROS  DRUG ALLERGIES:  No Known Allergies  VITALS:  Blood pressure 121/89, pulse 92, temperature 98.7 F (37.1 C), temperature source Oral, resp. rate 18, height 6\' 1"  (1.854 m), weight 104.3 kg, SpO2 100 %.  PHYSICAL EXAMINATION:  GENERAL:  58 y.o.-year-old patient lying in the bed with no acute distress.  EYES: Pupils equal, round, reactive to light and accommodation. No scleral icterus. Extraocular muscles intact.  HEENT: Head atraumatic, normocephalic. Oropharynx and nasopharynx clear.  NECK:  Supple, no jugular venous distention. No thyroid enlargement, no tenderness.  LUNGS: Normal breath sounds bilaterally, no wheezing, rales,rhonchi or crepitation. No use of accessory muscles of respiration.  CARDIOVASCULAR: S1, S2 normal. No murmurs, rubs, or gallops.  ABDOMEN: Soft, nontender, nondistended. Bowel sounds present. No organomegaly or mass.  EXTREMITIES: No pedal edema, cyanosis, or clubbing.   NEUROLOGIC: Cranial nerves II through XII are intact. Muscle strength 5/5 in all extremities. Sensation intact. Gait not checked.  PSYCHIATRIC: The patient is alert and oriented x 3.  SKIN: No obvious rash, lesion, or ulcer.   Physical Exam LABORATORY PANEL:   CBC Recent Labs  Lab 01/17/19 0333  WBC 5.5  HGB 10.7*  HCT 32.6*  PLT 33*   ------------------------------------------------------------------------------------------------------------------  Chemistries  Recent Labs  Lab 01/17/19 0333  NA 140  K 3.2*  CL 109  CO2 23  GLUCOSE 110*  BUN 15  CREATININE 0.93  CALCIUM 8.2*  MG 2.0  AST 115*  ALT 78*  ALKPHOS 96  BILITOT 2.4*   ------------------------------------------------------------------------------------------------------------------  Cardiac Enzymes Recent Labs  Lab 01/17/19 0333  TROPONINI <0.03   ------------------------------------------------------------------------------------------------------------------  RADIOLOGY:  Ct Head Wo Contrast  Result Date: 01/16/2019 CLINICAL DATA:  Seizure today, fall and hit head. EXAM: CT HEAD WITHOUT CONTRAST CT CERVICAL SPINE WITHOUT CONTRAST TECHNIQUE: Multidetector CT imaging of the head and cervical spine was performed following the standard protocol without intravenous contrast. Multiplanar CT image reconstructions of the cervical spine were also generated. COMPARISON:  None. FINDINGS: CT HEAD FINDINGS Brain: Mild generalized parenchymal volume loss with commensurate dilatation of the ventricles and sulci. Mild chronic small vessel ischemic changes within the deep periventricular white matter regions bilaterally. No mass, hemorrhage, edema or other evidence of acute parenchymal abnormality. No extra-axial hemorrhage. Vascular: No hyperdense vessel or unexpected calcification. Skull: Normal. Negative for fracture or focal lesion. Sinuses/Orbits: No acute finding. Other: None. CT CERVICAL SPINE FINDINGS Alignment:  Mild dextroscoliosis. Slight reversal of the normal cervical spine lordosis which is likely related to degenerative spondylosis in the lower cervical spine. No evidence  of acute vertebral body subluxation. Skull base and vertebrae: No fracture line or displaced fracture fragment seen. Facet joints are normally aligned throughout. Soft tissues and spinal canal: No prevertebral fluid or swelling. No visible canal hematoma. Disc levels: Degenerative spondylosis at the C5-6 and C6-7 levels, moderate in degree with associated disc space narrowings and osseous spurring. Associated disc-osteophytic bulges causing mild to moderate central canal stenoses. Additional degenerative hypertrophy of the uncovertebral and facet joints causing moderate bilateral neural foramen stenoses at C5-6 and C6-7 with possible associated nerve root impingement. Upper chest: Negative. Other: Bilateral carotid atherosclerosis. IMPRESSION: 1. No acute intracranial abnormality. No intracranial mass, hemorrhage or edema. No skull fracture. Mild chronic small vessel ischemic changes in the white matter. 2. No fracture or acute subluxation within the cervical spine. Degenerative changes of the lower cervical spine, as detailed above. 3. Carotid atherosclerosis. Electronically Signed   By: Bary Richardard M.D.   On: 01/16/2019 11:38   Ct Cervical Spine Wo Contrast  Result Date: 01/16/2019 CLINICAL DATA:  Seizure today, fall and hit head. EXAM: CT HEAD WITHOUT CONTRAST CT CERVICAL SPINE WITHOUT CONTRAST TECHNIQUE: Multidetector CT imaging of the head and cervical spine was performed following the standard protocol without intravenous contrast. Multiplanar CT image reconstructions of the cervical spine were also generated. COMPARISON:  None. FINDINGS: CT HEAD FINDINGS Brain: Mild generalized parenchymal volume loss with commensurate dilatation of the ventricles and sulci. Mild chronic small vessel ischemic changes within the deep periventricular white  matter regions bilaterally. No mass, hemorrhage, edema or other evidence of acute parenchymal abnormality. No extra-axial hemorrhage. Vascular: No hyperdense vessel or unexpected calcification. Skull: Normal. Negative for fracture or focal lesion. Sinuses/Orbits: No acute finding. Other: None. CT CERVICAL SPINE FINDINGS Alignment: Mild dextroscoliosis. Slight reversal of the normal cervical spine lordosis which is likely related to degenerative spondylosis in the lower cervical spine. No evidence of acute vertebral body subluxation. Skull base and vertebrae: No fracture line or displaced fracture fragment seen. Facet joints are normally aligned throughout. Soft tissues and spinal canal: No prevertebral fluid or swelling. No visible canal hematoma. Disc levels: Degenerative spondylosis at the C5-6 and C6-7 levels, moderate in degree with associated disc space narrowings and osseous spurring. Associated disc-osteophytic bulges causing mild to moderate central canal stenoses. Additional degenerative hypertrophy of the uncovertebral and facet joints causing moderate bilateral neural foramen stenoses at C5-6 and C6-7 with possible associated nerve root impingement. Upper chest: Negative. Other: Bilateral carotid atherosclerosis. IMPRESSION: 1. No acute intracranial abnormality. No intracranial mass, hemorrhage or edema. No skull fracture. Mild chronic small vessel ischemic changes in the white matter. 2. No fracture or acute subluxation within the cervical spine. Degenerative changes of the lower cervical spine, as detailed above. 3. Carotid atherosclerosis. Electronically Signed   By: Bary Richard M.D.   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 As he does not have history of seizure, he does not  need antiseizure medication.  *Rhabdomyolysis Continue IV fluid and follow CK level, coming down, follow tomorrow.  *Hypokalemia Replace oral.  Improving, magnesium normal.  *Alcoholic ketosis Dextrose drip was started by ER physician. Improved.  *Acute renal insufficiency We do not have baseline creatinine, patient does not follow regularly with physician, with IV fluid I would like to monitor tomorrow.  Improved.  *Elevated liver function enzymes Patient is a chronic alcohol drinker, recheck tomorrow for correction.  Improving.  *Thrombocytopenia Due to chronic alcoholism, monitor.  Slightly improved, no active bleed.  *Active smoking Counseled to quit smoking for 4 minutes and offered nicotine patch.   All the records are reviewed and case discussed with Care Management/Social Workerr. Management plans discussed with the patient, family and they are in agreement.  CODE STATUS: Full code.  TOTAL TIME TAKING CARE OF THIS PATIENT: 35 minutes.    POSSIBLE D/C IN 1-2 DAYS, DEPENDING ON CLINICAL CONDITION.   Altamese Dilling M.D on 01/17/2019   Between 7am to 6pm - Pager - [phone]  After 6pm go to [email or web address] - password [email or web address]  [facility]  Office  [phone]  CC: Primary care physician; System, Provider Not In  Note: This dictation was prepared with Dragon dictation along with smaller phrase technology. Any transcriptional errors that result from this process are unintentional.

## 2019-01-17 NOTE — Progress Notes (Signed)
*  PRELIMINARY RESULTS* Echocardiogram 2D Echocardiogram has been performed.  Cristela Blue 01/17/2019, 9:23 AM

## 2019-01-17 NOTE — Consult Note (Signed)
Reason for Consult: Rhabdo generalized weakness seizures Referring Physician: Dr. Elisabeth Pigeon hospitalist  Nicholas Durham is an 58 y.o. male.  HPI: Patient is a 58 year old white male with a long history of alcohol abuse states his been drinking more since the COVID pandemic.  He was binge drinking recently but then quit about a week ago patient states that he is been doing that for 2 months until he quit the patient reportedly was working at [employer] and had a witnessed seizure in his truck in the parking lot and then another 1 while walking in the parking lot.  He was brought to the emergency room for evaluation and assessment thought to be having alcohol withdrawal seizures rhabdo elevated liver enzymes generalized weakness and fatigue since patient was admitted for further assessment.  Cardiology was then consulted because of elevated CPK with nondiagnostic EKG  Past Medical History:  Diagnosis Date  . ETOH abuse     Past Surgical History:  Procedure Laterality Date  . APPENDECTOMY      History reviewed. No pertinent family history.  Social History:  reports that he has been smoking cigarettes. He has been smoking about 0.50 packs per day. He has never used smokeless tobacco. He reports current alcohol use. He reports current drug use. Drug: Marijuana.  Allergies: No Known Allergies  Medications: I have reviewed the patient's current medications.  Results for orders placed or performed during the hospital encounter of 01/16/19 (from the past 48 hour(s))  CBC     Status: Abnormal   Collection Time: 01/16/19 11:13 AM  Result Value Ref Range   WBC 16.3 (H) 4.0 - 10.5 K/uL   RBC 3.70 (L) 4.22 - 5.81 MIL/uL   Hemoglobin 13.7 13.0 - 17.0 g/dL   HCT 09.4 70.9 - 62.8 %   MCV 112.7 (H) 80.0 - 100.0 fL   MCH 37.0 (H) 26.0 - 34.0 pg   MCHC 32.9 30.0 - 36.0 g/dL   RDW 36.6 29.4 - 76.5 %   Platelets 59 (L) 150 - 400 K/uL    Comment: Immature Platelet Fraction may be clinically indicated,  consider ordering this additional test [id number]    nRBC 0.0 0.0 - 0.2 %    Comment: Performed at [facility], [address] [place], [place] [address]  Comprehensive metabolic panel     Status: Abnormal   Collection Time: 01/16/19 11:13 AM  Result Value Ref Range   Sodium 135 135 - 145 mmol/L   Potassium 3.2 (L) 3.5 - 5.1 mmol/L   Chloride 100 98 - 111 mmol/L   CO2 12 (L) 22 - 32 mmol/L   Glucose, Bld 133 (H) 70 - 99 mg/dL   BUN 14 6 - 20 mg/dL   Creatinine, Ser 7.51 (H) 0.61 - 1.24 mg/dL   Calcium 9.0 8.9 - 70.0 mg/dL   Total Protein 8.0 6.5 - 8.1 g/dL   Albumin 4.1 3.5 - 5.0 g/dL   AST 174 (H) 15 - 41 U/L    Comment: RESULT CONFIRMED BY MANUAL DILUTION AKT   ALT 113 (H) 0 - 44 U/L    Comment: RESULT CONFIRMED BY MANUAL DILUTION AKT   Alkaline Phosphatase 117 38 - 126 U/L   Total Bilirubin 4.3 (H) 0.3 - 1.2 mg/dL   GFR calc non Af Amer 49 (L) >60 mL/min   GFR calc Af Amer 57 (L) >60 mL/min   Anion gap 23 (H) 5 - 15    Comment: Performed at [facility], [address] [place], [place] [address]  Lactic acid, plasma     Status: None   Collection Time: 01/16/19  1:56 PM  Result Value Ref Range   Lactic Acid, Venous 1.5 0.5 - 1.9 mmol/L    Comment: Performed at [facility], [address] [place], [place] [address]  Blood gas, venous     Status: Abnormal (Preliminary result)   Collection Time: 01/16/19  1:56 PM  Result Value Ref Range   pH, Ven 7.41 7.250 - 7.430   pCO2, Ven 39 (L) 44.0 - 60.0 mmHg   pO2, Ven PENDING 32.0 - 45.0 mmHg   Bicarbonate 24.7 20.0 - 28.0 mmol/L   Acid-Base Excess 0.1 0.0 - 2.0 mmol/L   O2 Saturation 51.2 %   Patient temperature 37.0    Collection site VEIN    Sample type VEIN     Comment: Performed at [facility], [address] [place], [place] [address]  CK     Status: Abnormal   Collection Time: 01/16/19  1:56 PM  Result Value Ref Range   Total CK 1,082 (H) 49 - 397 U/L    Comment: Performed  at [facility], [address] [place], [place] [address]  Magnesium     Status: None   Collection Time: 01/16/19  1:56 PM  Result Value Ref Range   Magnesium 2.1 1.7 - 2.4 mg/dL    Comment: Performed at [facility], [address] [place], [place] [address]  SARS Coronavirus 2 [facility](Hospital order, Performed in [facility] hospital lab)     Status: None   Collection Time: 01/16/19  1:56 PM  Result Value Ref Range   SARS Coronavirus 2 NEGATIVE NEGATIVE    Comment: (NOTE) If result is NEGATIVE SARS-CoV-2 target nucleic acids are NOT DETECTED. The SARS-CoV-2 RNA is generally detectable in upper and lower  respiratory specimens during the acute phase of infection. The lowest  concentration of SARS-CoV-2 viral copies this assay can detect is 250  copies / mL. A negative result does not preclude SARS-CoV-2 infection  and should not be used as the sole basis for treatment or other  patient management decisions.  A negative result may occur with  improper specimen collection / handling, submission of specimen other  than nasopharyngeal swab, presence of viral mutation(s) within the  areas targeted by this assay, and inadequate number of viral copies  (<250 copies / mL). A negative result must be combined with clinical  observations, patient history, and epidemiological information. If result is POSITIVE SARS-CoV-2 target nucleic acids are DETECTED. The SARS-CoV-2 RNA is generally detectable in upper and lower  respiratory specimens dur ing the acute phase of infection.  Positive  results are indicative of active infection with SARS-CoV-2.  Clinical  correlation with patient history and other diagnostic information is  necessary to determine patient infection status.  Positive results do  not rule out bacterial infection or co-infection with other viruses. If result is PRESUMPTIVE POSTIVE SARS-CoV-2 nucleic acids MAY BE PRESENT.   A presumptive positive result was  obtained on the submitted specimen  and confirmed on repeat testing.  While 2019 novel coronavirus  (SARS-CoV-2) nucleic acids may be present in the submitted sample  additional confirmatory testing may be necessary for epidemiological  and / or clinical management purposes  to differentiate between  SARS-CoV-2 and other Sarbecovirus currently known to infect humans.  If clinically indicated additional testing with an alternate test  methodology [phone]([id number]) is advised. The SARS-CoV-2 RNA  is generally  detectable in upper and lower respiratory sp ecimens during the acute  phase of infection. The expected result is Negative. Fact Sheet for Patients:  BoilerBrush.com.cy Fact Sheet for Healthcare Providers: https://pope.com/ This test is not yet approved or cleared by the [place] FDA and has been authorized for detection and/or diagnosis of SARS-CoV-2 by FDA under an Emergency Use Authorization (EUA).  This EUA will remain in effect (meaning this test can be used) for the duration of the COVID-19 declaration under Section 564(b)(1) of the Act, 21 U.S.C. section 360bbb-3(b)(1), unless the authorization is terminated or revoked sooner. Performed at [facility], [address] [place], [place] [address]   CBC     Status: Abnormal   Collection Time: 01/17/19  3:33 AM  Result Value Ref Range   WBC 5.5 4.0 - 10.5 K/uL   RBC 2.91 (L) 4.22 - 5.81 MIL/uL   Hemoglobin 10.7 (L) 13.0 - 17.0 g/dL   HCT 30.8 (L) 65.7 - 84.6 %   MCV 112.0 (H) 80.0 - 100.0 fL   MCH 36.8 (H) 26.0 - 34.0 pg   MCHC 32.8 30.0 - 36.0 g/dL   RDW 96.2 95.2 - 84.1 %   Platelets 33 (L) 150 - 400 K/uL    Comment: Immature Platelet Fraction may be clinically indicated, consider ordering this additional test [id number] CONSISTENT WITH PREVIOUS RESULT    nRBC 0.0 0.0 - 0.2 %    Comment: Performed at [facility], [address] [place], [place]  [address]  Comprehensive metabolic panel     Status: Abnormal   Collection Time: 01/17/19  3:33 AM  Result Value Ref Range   Sodium 140 135 - 145 mmol/L   Potassium 3.2 (L) 3.5 - 5.1 mmol/L   Chloride 109 98 - 111 mmol/L   CO2 23 22 - 32 mmol/L   Glucose, Bld 110 (H) 70 - 99 mg/dL   BUN 15 6 - 20 mg/dL   Creatinine, Ser 6.44 0.61 - 1.24 mg/dL   Calcium 8.2 (L) 8.9 - 10.3 mg/dL   Total Protein 5.9 (L) 6.5 - 8.1 g/dL   Albumin 3.0 (L) 3.5 - 5.0 g/dL   AST 034 (H) 15 - 41 U/L   ALT 78 (H) 0 - 44 U/L   Alkaline Phosphatase 96 38 - 126 U/L   Total Bilirubin 2.4 (H) 0.3 - 1.2 mg/dL   GFR calc non Af Amer >60 >60 mL/min   GFR calc Af Amer >60 >60 mL/min   Anion gap 8 5 - 15    Comment: Performed at [facility], [address] [place], [place] [address]  CK     Status: Abnormal   Collection Time: 01/17/19  3:33 AM  Result Value Ref Range   Total CK 954 (H) 49 - 397 U/L    Comment: Performed at [facility], [address] [place], [place] [address]  Magnesium     Status: None   Collection Time: 01/17/19  3:33 AM  Result Value Ref Range   Magnesium 2.0 1.7 - 2.4 mg/dL    Comment: Performed at [facility], [address] [place], [place] [address]  Troponin I - Once     Status: None   Collection Time: 01/17/19  3:33 AM  Result Value Ref Range   Troponin I <0.03 <0.03 ng/mL    Comment: Performed at [facility], [address], [place], [place] [address]    Ct Head Wo Contrast  Result Date: 01/16/2019 CLINICAL DATA:  Seizure today, fall and hit head. EXAM: CT HEAD WITHOUT CONTRAST CT CERVICAL SPINE WITHOUT CONTRAST TECHNIQUE: Multidetector CT imaging of the head and cervical spine was performed following the standard protocol without intravenous contrast. Multiplanar CT image reconstructions of the cervical spine were also generated. COMPARISON:  None. FINDINGS: CT HEAD FINDINGS Brain: Mild generalized parenchymal volume loss with commensurate  dilatation of the ventricles and sulci. Mild chronic small vessel ischemic changes within the deep periventricular white matter regions bilaterally. No mass, hemorrhage, edema or other evidence of acute parenchymal abnormality. No extra-axial hemorrhage. Vascular: No hyperdense vessel or unexpected calcification. Skull: Normal. Negative for fracture or focal lesion. Sinuses/Orbits: No acute finding. Other: None. CT CERVICAL SPINE FINDINGS Alignment: Mild dextroscoliosis. Slight reversal of the normal cervical spine lordosis which is likely related to degenerative spondylosis in the lower cervical spine. No evidence of acute vertebral body subluxation. Skull base and vertebrae: No fracture line or displaced fracture fragment seen. Facet joints are normally aligned throughout. Soft tissues and spinal canal: No prevertebral fluid or swelling. No visible canal hematoma. Disc levels: Degenerative spondylosis at the C5-6 and C6-7 levels, moderate in degree with associated disc space narrowings and osseous spurring. Associated disc-osteophytic bulges causing mild to moderate central canal stenoses. Additional degenerative hypertrophy of the uncovertebral and facet joints causing moderate bilateral neural foramen stenoses at C5-6 and C6-7 with possible associated nerve root impingement. Upper chest: Negative. Other: Bilateral carotid atherosclerosis. IMPRESSION: 1. No acute intracranial abnormality. No intracranial mass, hemorrhage or edema. No skull fracture. Mild chronic small vessel ischemic changes in the white matter. 2. No fracture or acute subluxation within the cervical spine. Degenerative changes of the lower cervical spine, as detailed above. 3. Carotid atherosclerosis. Electronically Signed   By: Bary Richard M.D.   On: 01/16/2019 11:38   Ct Cervical Spine Wo Contrast  Result Date: 01/16/2019 CLINICAL DATA:  Seizure today, fall and hit head. EXAM: CT HEAD WITHOUT CONTRAST CT CERVICAL SPINE WITHOUT CONTRAST  TECHNIQUE: Multidetector CT imaging of the head and cervical spine was performed following the standard protocol without intravenous contrast. Multiplanar CT image reconstructions of the cervical spine were also generated. COMPARISON:  None. FINDINGS: CT HEAD FINDINGS Brain: Mild generalized parenchymal volume loss with commensurate dilatation of the ventricles and sulci. Mild chronic small vessel ischemic changes within the deep periventricular white matter regions bilaterally. No mass, hemorrhage, edema or other evidence of acute parenchymal abnormality. No extra-axial hemorrhage. Vascular: No hyperdense vessel or unexpected calcification. Skull: Normal. Negative for fracture or focal lesion. Sinuses/Orbits: No acute finding. Other: None. CT CERVICAL SPINE FINDINGS Alignment: Mild dextroscoliosis. Slight reversal of the normal cervical spine lordosis which is likely related to degenerative spondylosis in the lower cervical spine. No evidence of acute vertebral body subluxation. Skull base and vertebrae: No fracture line or displaced fracture fragment seen. Facet joints are normally aligned throughout. Soft tissues and spinal canal: No prevertebral fluid or swelling. No visible canal hematoma. Disc levels: Degenerative spondylosis at the C5-6 and C6-7 levels, moderate in degree with associated disc space narrowings and osseous spurring. Associated disc-osteophytic bulges causing mild to moderate central canal stenoses. Additional degenerative hypertrophy of the uncovertebral and facet joints causing moderate bilateral neural foramen stenoses at C5-6 and C6-7 with possible associated nerve root impingement. Upper chest: Negative. Other: Bilateral carotid atherosclerosis. IMPRESSION: 1. No acute intracranial abnormality. No intracranial mass, hemorrhage or edema. No skull fracture. Mild chronic small vessel ischemic changes in the white matter. 2.  No fracture or acute subluxation within the cervical spine.  Degenerative changes of the lower cervical spine, as detailed above. 3. Carotid atherosclerosis. Electronically Signed   By: Bary Richard M.D.   On: 01/16/2019 11:38    Review of Systems  Constitutional: Positive for diaphoresis and malaise/fatigue.  HENT: Negative.   Eyes: Negative.   Respiratory: Positive for shortness of breath.   Cardiovascular: Positive for palpitations.  Gastrointestinal: Positive for heartburn.  Genitourinary: Negative.   Musculoskeletal: Positive for falls and myalgias.  Skin: Negative.   Neurological: Positive for dizziness, seizures and loss of consciousness.  Endo/Heme/Allergies: Negative.   Psychiatric/Behavioral: Negative.    Blood pressure 121/89, pulse 92, temperature 98.7 F (37.1 C), temperature source Oral, resp. rate 18, height  (1.854 m), weight 104.3 kg, SpO2 100 %. Physical Exam  Nursing note and vitals reviewed. Constitutional: He is oriented to person, place, and time. He appears well-developed and well-nourished.  HENT:  Head: Normocephalic and atraumatic.  Eyes: Pupils are equal, round, and reactive to light. Conjunctivae and EOM are normal.  Neck: Normal range of motion. Neck supple.  Cardiovascular: Normal rate, regular rhythm and normal heart sounds.  Respiratory: Effort normal and breath sounds normal.  GI: Soft. Bowel sounds are normal.  Musculoskeletal: Normal range of motion.  Neurological: He is alert and oriented to person, place, and time. He has normal reflexes.  Skin: Skin is warm.  Psychiatric: He has a normal mood and affect.    Assessment/Plan: Seizures withdrawal Alcohol abuse Obesity Rhabdomyolysis Alcoholic ketosis Acute renal insufficiency Thrombocytopenia Elevated liver functions Smoking . Plan Agree with admit Continue continue hydration Recommend CIWA protocol for withdrawal Advised patient refrain from smoking Refrain from alcohol abuse Consider echocardiogram for further assessment  Jalayah Gutridge D  Shenita Trego 01/17/2019, 4:45 PM

## 2019-01-18 LAB — CBC
HCT: 31.3 % — ABNORMAL LOW (ref 39.0–52.0)
Hemoglobin: 10.4 g/dL — ABNORMAL LOW (ref 13.0–17.0)
MCH: 37.4 pg — ABNORMAL HIGH (ref 26.0–34.0)
MCHC: 33.2 g/dL (ref 30.0–36.0)
MCV: 112.6 fL — ABNORMAL HIGH (ref 80.0–100.0)
Platelets: 30 10*3/uL — ABNORMAL LOW (ref 150–400)
RBC: 2.78 MIL/uL — ABNORMAL LOW (ref 4.22–5.81)
RDW: 13.4 % (ref 11.5–15.5)
WBC: 3.3 10*3/uL — ABNORMAL LOW (ref 4.0–10.5)
nRBC: 0 % (ref 0.0–0.2)

## 2019-01-18 LAB — BASIC METABOLIC PANEL
Anion gap: 6 (ref 5–15)
BUN: 14 mg/dL (ref 6–20)
CO2: 22 mmol/L (ref 22–32)
Calcium: 7.9 mg/dL — ABNORMAL LOW (ref 8.9–10.3)
Chloride: 111 mmol/L (ref 98–111)
Creatinine, Ser: 0.7 mg/dL (ref 0.61–1.24)
GFR calc Af Amer: >60 mL/min (ref >60)
GFR calc non Af Amer: >60 mL/min (ref >60)
Glucose, Bld: 105 mg/dL — ABNORMAL HIGH (ref 70–99)
Potassium: 3.7 mmol/L (ref 3.5–5.1)
Sodium: 139 mmol/L (ref 135–145)

## 2019-01-18 LAB — HIV ANTIBODY (ROUTINE TESTING W REFLEX): HIV Screen 4th Generation wRfx: NONREACTIVE

## 2019-01-18 MED ORDER — ADULT MULTIVITAMIN W/MINERALS CH: 1.0000 | ORAL_TABLET | Freq: Every day | ORAL | 0 refills | Status: AC

## 2019-01-18 MED ORDER — FOLIC ACID 1 MG PO TABS: 1.0000 mg | ORAL_TABLET | Freq: Every day | ORAL | 0 refills | Status: AC

## 2019-01-18 MED ORDER — NICOTINE 21 MG/24HR TD PT24: 21.0000 mg | MEDICATED_PATCH | Freq: Every day | TRANSDERMAL | 0 refills | Status: AC

## 2019-01-18 MED ORDER — THIAMINE HCL 100 MG PO TABS: 100.0000 mg | ORAL_TABLET | Freq: Every day | ORAL | 0 refills | Status: AC

## 2019-01-18 NOTE — Progress Notes (Signed)
Pt discharged per MD order. IVs removed. Discharge instructions reviewed with pt. Pt verbalized understanding. Pt declined wheelchair and walked downstairs.

## 2019-01-18 NOTE — TOC Transition Note (Signed)
Transition of Care [facility]) - CM/SW Discharge Note   Patient Details  Name: Nicholas Durham MRN: [id number] Date of Birth: 19-Jul-1961  Transition of Care [facility]) CM/SW Contact:  Chapman Fitch, RN Phone Number: 01/18/2019, 12:22 PM   Clinical Narrative:    Admitted with alcohol related seizure. Patient states he lives in the basement of his parents home.  States that the basement has a separate entrance and "I try not to bother them unless I need something"  Patient in employed.  State he has his own car, and denies issues with transportation.  Patient does not have a PCP.  Provided with resources in [place].  RNCM also discussed Sliding scale clinics in [facility].  "The Network:  Your Guide to [employer] in [facility]"  Booklet.  Patient declined for any follow up appointments to be made.  Patient was agreeable to accept resources and states he will make and appointment if he changes his mind.   Patient denies issues obtaining medications.  Folic acid, thiamine, and multivitamins can be purchased over the counter    Final next level of care: Home/Self Care Barriers to Discharge: Barriers Resolved   Patient Goals and CMS Choice        Discharge Placement                       Discharge Plan and Services   Discharge Planning Services: CM Consult, [facility]                                 Social Determinants of Health (SDOH) Interventions     Readmission Risk Interventions No flowsheet data found.

## 2019-01-18 NOTE — Discharge Summary (Signed)
[facility] [place] at [facility]   PATIENT NAME: Nicholas Durham    MR#:  [id number]  DATE OF BIRTH:  [birth year]  DATE OF ADMISSION:  01/16/2019 ADMITTING PHYSICIAN: Altamese Dilling, MD  DATE OF DISCHARGE: No discharge date for patient encounter.  PRIMARY CARE PHYSICIAN: System, Provider Not In    ADMISSION DIAGNOSIS:  Alcohol related seizure (HCC) [R56.9] High anion gap metabolic acidosis [E87.2] Non-traumatic rhabdomyolysis [M62.82]  DISCHARGE DIAGNOSIS:  Active Problems:   Perceptual disturbances and seizures concurrent with and due to alcohol withdrawal (HCC)   SECONDARY DIAGNOSIS:   Past Medical History:  Diagnosis Date  . ETOH abuse     HOSPITAL COURSE:  *Alcohol withdrawal seizure Resolved Treated on our alcohol withdrawal protocol, seizure precautions, he does not need antiseizure medication.  *Rhabdomyolysis, minimal Treated with IV fluids for rehydration, renal function normal  *Hypokalemia Replaced  *Alcoholic ketosis Treated with IV fluids rehydration  *Acute renal insufficiency Resolved with IV fluids for rehydration  *Elevated liver function enzymes Likely secondary to chronic liver disease due to alcohol abuse Stable  *Thrombocytopenia Likely chronic secondary to chronic alcohol abuse Stable No active bleeding noted  *Active smoking Counseled to quit smoking for 4 minutes and offered nicotine patch   DISCHARGE CONDITIONS:   stable  CONSULTS OBTAINED:  Treatment Team:  Alwyn Pea, MD Kaspian Muccio, Evelena Asa, MD  DRUG ALLERGIES:  No Known Allergies  DISCHARGE MEDICATIONS:   Allergies as of 01/18/2019   No Known Allergies     Medication List    TAKE these medications   folic acid 1 MG tablet Commonly known as:  FOLVITE Take 1 tablet (1 mg total) by mouth daily.   Ibuprofen-diphenhydrAMINE HCl 200-25 MG Caps Take 1-2 capsules by mouth at bedtime as needed (sleep).   multivitamin  with minerals Tabs tablet Take 1 tablet by mouth daily.   nicotine 21 mg/24hr patch Commonly known as:  NICODERM CQ - dosed in mg/24 hours Place 1 patch (21 mg total) onto the skin daily.   thiamine 100 MG tablet Take 1 tablet (100 mg total) by mouth daily.        DISCHARGE INSTRUCTIONS:   If you experience worsening of your admission symptoms, develop shortness of breath, life threatening emergency, suicidal or homicidal thoughts you must seek medical attention immediately by calling 911 or calling your MD immediately  if symptoms less severe.  You Must read complete instructions/literature along with all the possible adverse reactions/side effects for all the Medicines you take and that have been prescribed to you. Take any new Medicines after you have completely understood and accept all the possible adverse reactions/side effects.   Please note  You were cared for by a hospitalist during your hospital stay. If you have any questions about your discharge medications or the care you received while you were in the hospital after you are discharged, you can call the unit and asked to speak with the hospitalist on call if the hospitalist that took care of you is not available. Once you are discharged, your primary care physician will handle any further medical issues. Please note that NO REFILLS for any discharge medications will be authorized once you are discharged, as it is imperative that you return to your primary care physician (or establish a relationship with a primary care physician if you do not have one) for your aftercare needs so that they can reassess your need for medications and monitor your lab values.    Today  CHIEF COMPLAINT:   Chief Complaint  Patient presents with  . Seizures    HISTORY OF PRESENT ILLNESS:   58 y.o. male with a known history of alcohol abuse, no known medical issues but he does not go to a physician regularly. Since he is staying home and  not have much work for last 36-month due to COVID-19-he is drinking more alcohol every day.  Since last Friday (3 days ago) he decided he would not drink anymore and stopped.  Today at home he had one episode of seizures where his son saw him and brought to the emergency room.  While coming to ER he forgot his cell phone in the parking lot in car so went back and again had 2 more seizure episodes in the ER and parking lot. ER physician gave to admit to hospitalist service for alcohol withdrawal seizures.  Patient does not have history of seizures.  VITAL SIGNS:  Blood pressure (!) 146/80, pulse 74, temperature 98.6 F (37 C), temperature source Oral, resp. rate 18, height  (1.854 m), weight 104.3 kg, SpO2 100 %.  I/O:    Intake/Output Summary (Last 24 hours) at 01/18/2019 1007 Last data filed at 01/18/2019 4098 Gross per 24 hour  Intake 1273.3 ml  Output 1175 ml  Net 98.3 ml    PHYSICAL EXAMINATION:  GENERAL:  58 y.o.-year-old patient lying in the bed with no acute distress.  EYES: Pupils equal, round, reactive to light and accommodation. No scleral icterus. Extraocular muscles intact.  HEENT: Head atraumatic, normocephalic. Oropharynx and nasopharynx clear.  NECK:  Supple, no jugular venous distention. No thyroid enlargement, no tenderness.  LUNGS: Normal breath sounds bilaterally, no wheezing, rales,rhonchi or crepitation. No use of accessory muscles of respiration.  CARDIOVASCULAR: S1, S2 normal. No murmurs, rubs, or gallops.  ABDOMEN: Soft, non-tender, non-distended. Bowel sounds present. No organomegaly or mass.  EXTREMITIES: No pedal edema, cyanosis, or clubbing.  NEUROLOGIC: Cranial nerves II through XII are intact. Muscle strength 5/5 in all extremities. Sensation intact. Gait not checked.  PSYCHIATRIC: The patient is alert and oriented x 3.  SKIN: No obvious rash, lesion, or ulcer.   DATA REVIEW:   CBC Recent Labs  Lab 01/18/19 0539  WBC 3.3*  HGB 10.4*  HCT 31.3*   PLT 30*    Chemistries  Recent Labs  Lab 01/17/19 0333 01/18/19 0539  NA 140 139  K 3.2* 3.7  CL 109 111  CO2 23 22  GLUCOSE 110* 105*  BUN 15 14  CREATININE 0.93 0.70  CALCIUM 8.2* 7.9*  MG 2.0  --   AST 115*  --   ALT 78*  --   ALKPHOS 96  --   BILITOT 2.4*  --     Cardiac Enzymes Recent Labs  Lab 01/17/19 0333  TROPONINI <0.03    Microbiology Results  Results for orders placed or performed during the hospital encounter of 01/16/19  SARS Coronavirus 2 [facility] order, Performed in [facility] hospital lab)     Status: None   Collection Time: 01/16/19  1:56 PM  Result Value Ref Range Status   SARS Coronavirus 2 NEGATIVE NEGATIVE Final    Comment: (NOTE) If result is NEGATIVE SARS-CoV-2 target nucleic acids are NOT DETECTED. The SARS-CoV-2 RNA is generally detectable in upper and lower  respiratory specimens during the acute phase of infection. The lowest  concentration of SARS-CoV-2 viral copies this assay can detect is 250  copies / mL. A negative result does not preclude SARS-CoV-2  infection  and should not be used as the sole basis for treatment or other  patient management decisions.  A negative result may occur with  improper specimen collection / handling, submission of specimen other  than nasopharyngeal swab, presence of viral mutation(s) within the  areas targeted by this assay, and inadequate number of viral copies  (<250 copies / mL). A negative result must be combined with clinical  observations, patient history, and epidemiological information. If result is POSITIVE SARS-CoV-2 target nucleic acids are DETECTED. The SARS-CoV-2 RNA is generally detectable in upper and lower  respiratory specimens dur ing the acute phase of infection.  Positive  results are indicative of active infection with SARS-CoV-2.  Clinical  correlation with patient history and other diagnostic information is  necessary to determine patient infection status.  Positive  results do  not rule out bacterial infection or co-infection with other viruses. If result is PRESUMPTIVE POSTIVE SARS-CoV-2 nucleic acids MAY BE PRESENT.   A presumptive positive result was obtained on the submitted specimen  and confirmed on repeat testing.  While 2019 novel coronavirus  (SARS-CoV-2) nucleic acids may be present in the submitted sample  additional confirmatory testing may be necessary for epidemiological  and / or clinical management purposes  to differentiate between  SARS-CoV-2 and other Sarbecovirus currently known to infect humans.  If clinically indicated additional testing with an alternate test  methodology [phone]) is advised. The SARS-CoV-2 RNA is generally  detectable in upper and lower respiratory sp ecimens during the acute  phase of infection. The expected result is Negative. Fact Sheet for Patients:  BoilerBrush.com.cy Fact Sheet for Healthcare Providers: https://pope.com/ This test is not yet approved or cleared by the [place] FDA and has been authorized for detection and/or diagnosis of SARS-CoV-2 by FDA under an Emergency Use Authorization (EUA).  This EUA will remain in effect (meaning this test can be used) for the duration of the COVID-19 declaration under Section 564(b)(1) of the Act, 21 U.S.C. section 360bbb-3(b)(1), unless the authorization is terminated or revoked sooner. Performed at [facility], [address] [place], [place] [address]     RADIOLOGY:  Ct Head Wo Contrast  Result Date: 01/16/2019 CLINICAL DATA:  Seizure today, fall and hit head. EXAM: CT HEAD WITHOUT CONTRAST CT CERVICAL SPINE WITHOUT CONTRAST TECHNIQUE: Multidetector CT imaging of the head and cervical spine was performed following the standard protocol without intravenous contrast. Multiplanar CT image reconstructions of the cervical spine were also generated. COMPARISON:  None. FINDINGS: CT HEAD FINDINGS  Brain: Mild generalized parenchymal volume loss with commensurate dilatation of the ventricles and sulci. Mild chronic small vessel ischemic changes within the deep periventricular white matter regions bilaterally. No mass, hemorrhage, edema or other evidence of acute parenchymal abnormality. No extra-axial hemorrhage. Vascular: No hyperdense vessel or unexpected calcification. Skull: Normal. Negative for fracture or focal lesion. Sinuses/Orbits: No acute finding. Other: None. CT CERVICAL SPINE FINDINGS Alignment: Mild dextroscoliosis. Slight reversal of the normal cervical spine lordosis which is likely related to degenerative spondylosis in the lower cervical spine. No evidence of acute vertebral body subluxation. Skull base and vertebrae: No fracture line or displaced fracture fragment seen. Facet joints are normally aligned throughout. Soft tissues and spinal canal: No prevertebral fluid or swelling. No visible canal hematoma. Disc levels: Degenerative spondylosis at the C5-6 and C6-7 levels, moderate in degree with associated disc space narrowings and osseous spurring. Associated disc-osteophytic bulges causing mild to moderate central canal stenoses. Additional degenerative hypertrophy of the uncovertebral  and facet joints causing moderate bilateral neural foramen stenoses at C5-6 and C6-7 with possible associated nerve root impingement. Upper chest: Negative. Other: Bilateral carotid atherosclerosis. IMPRESSION: 1. No acute intracranial abnormality. No intracranial mass, hemorrhage or edema. No skull fracture. Mild chronic small vessel ischemic changes in the white matter. 2. No fracture or acute subluxation within the cervical spine. Degenerative changes of the lower cervical spine, as detailed above. 3. Carotid atherosclerosis. Electronically Signed   By: Bary RichardStan  Maynard M.D.   On: 01/16/2019 11:38   Ct Cervical Spine Wo Contrast  Result Date: 01/16/2019 CLINICAL DATA:  Seizure today, fall and hit head.  EXAM: CT HEAD WITHOUT CONTRAST CT CERVICAL SPINE WITHOUT CONTRAST TECHNIQUE: Multidetector CT imaging of the head and cervical spine was performed following the standard protocol without intravenous contrast. Multiplanar CT image reconstructions of the cervical spine were also generated. COMPARISON:  None. FINDINGS: CT HEAD FINDINGS Brain: Mild generalized parenchymal volume loss with commensurate dilatation of the ventricles and sulci. Mild chronic small vessel ischemic changes within the deep periventricular white matter regions bilaterally. No mass, hemorrhage, edema or other evidence of acute parenchymal abnormality. No extra-axial hemorrhage. Vascular: No hyperdense vessel or unexpected calcification. Skull: Normal. Negative for fracture or focal lesion. Sinuses/Orbits: No acute finding. Other: None. CT CERVICAL SPINE FINDINGS Alignment: Mild dextroscoliosis. Slight reversal of the normal cervical spine lordosis which is likely related to degenerative spondylosis in the lower cervical spine. No evidence of acute vertebral body subluxation. Skull base and vertebrae: No fracture line or displaced fracture fragment seen. Facet joints are normally aligned throughout. Soft tissues and spinal canal: No prevertebral fluid or swelling. No visible canal hematoma. Disc levels: Degenerative spondylosis at the C5-6 and C6-7 levels, moderate in degree with associated disc space narrowings and osseous spurring. Associated disc-osteophytic bulges causing mild to moderate central canal stenoses. Additional degenerative hypertrophy of the uncovertebral and facet joints causing moderate bilateral neural foramen stenoses at C5-6 and C6-7 with possible associated nerve root impingement. Upper chest: Negative. Other: Bilateral carotid atherosclerosis. IMPRESSION: 1. No acute intracranial abnormality. No intracranial mass, hemorrhage or edema. No skull fracture. Mild chronic small vessel ischemic changes in the white matter. 2. No  fracture or acute subluxation within the cervical spine. Degenerative changes of the lower cervical spine, as detailed above. 3. Carotid atherosclerosis. Electronically Signed   By: Bary RichardStan  Maynard M.D.   On: 01/16/2019 11:38    EKG:   Orders placed or performed during the hospital encounter of 01/16/19  . ED EKG  . ED EKG  . EKG 12-Lead  . EKG 12-Lead  . EKG 12-Lead  . EKG 12-Lead      Management plans discussed with the patient, family and they are in agreement.  CODE STATUS:     Code Status Orders  (From admission, onward)         Start     Ordered   01/16/19 1559  Full code  Continuous     01/16/19 1559        Code Status History    This patient has a current code status but no historical code status.      TOTAL TIME TAKING CARE OF THIS PATIENT: 35 minutes.    Evelena AsaMontell D Deziah Renwick M.D on 01/18/2019 at 10:07 AM  Between 7am to 6pm - Pager - [phone]  After 6pm go to [email or web address] - password [email or web address]  [facility] [place] [facility]  Office  [phone]  CC: Primary care physician; System, Provider Not In  Note: This dictation was prepared with Dragon dictation along with smaller phrase technology. Any transcriptional errors that result from this process are unintentional.

## 2019-01-26 LAB — BLOOD GAS, VENOUS
Acid-Base Excess: 0.1 mmol/L (ref 0.0–2.0)
Bicarbonate: 24.7 mmol/L (ref 20.0–28.0)
O2 Saturation: 51.2 %
Patient temperature: 37
pCO2, Ven: 39 mmHg — ABNORMAL LOW (ref 44.0–60.0)
pH, Ven: 7.41 (ref 7.250–7.430)

## 2020-06-27 IMAGING — CT CT HEAD WITHOUT CONTRAST
4 of 7 series · 14 of 47 positions shown, 15 images · non-contrast
Comparison: None.

CLINICAL DATA: Seizure today, fall and hit head.

EXAM:
CT HEAD WITHOUT CONTRAST
CT CERVICAL SPINE WITHOUT CONTRAST
TECHNIQUE: Multidetector CT imaging of the head and cervical spine was
performed following the standard protocol without intravenous
contrast. Multiplanar CT image reconstructions of the cervical spine
were also generated.

[Series 2: head wo · axial · 0.43mm/px · z∈[+377,+432]mm · 2 of 33 slices shown, 3 images]
[im 11/33  brain]
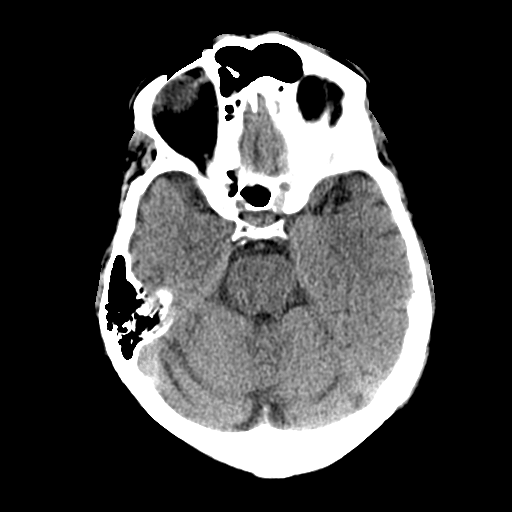
[im 11/33  bone]
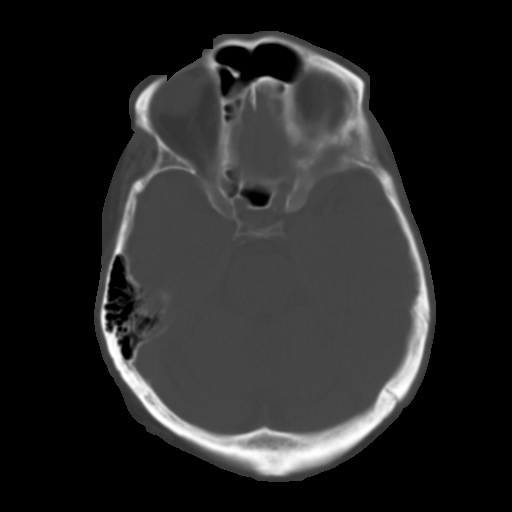
[im 22/33  brain]
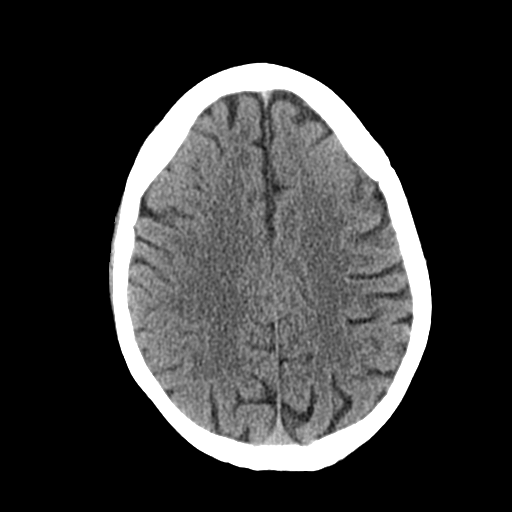

[Series 8: coronal soft tissue · coronal · 0.33mm/px · 3 of 69 slices shown]
[im 23/69  brain]
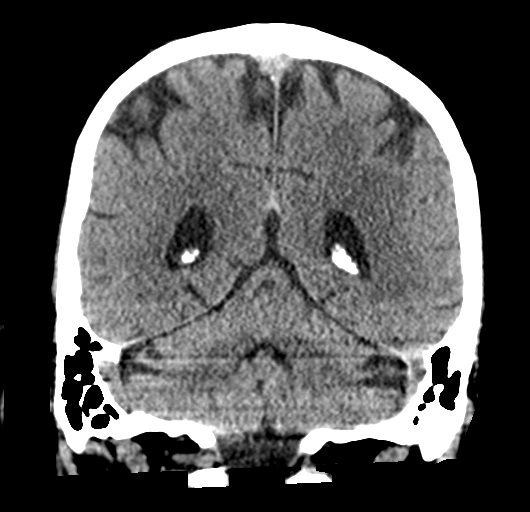
[im 35/69  brain]
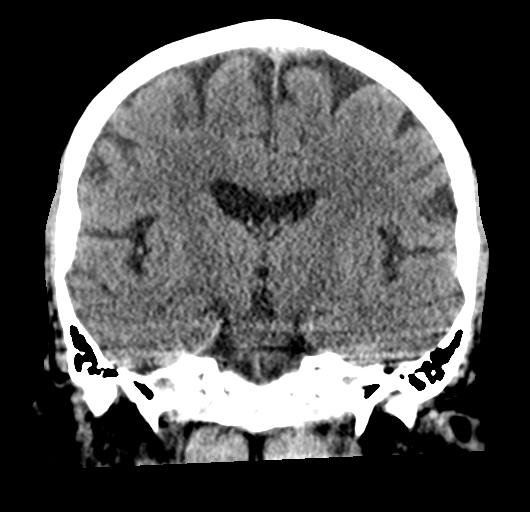
[im 46/69  brain]
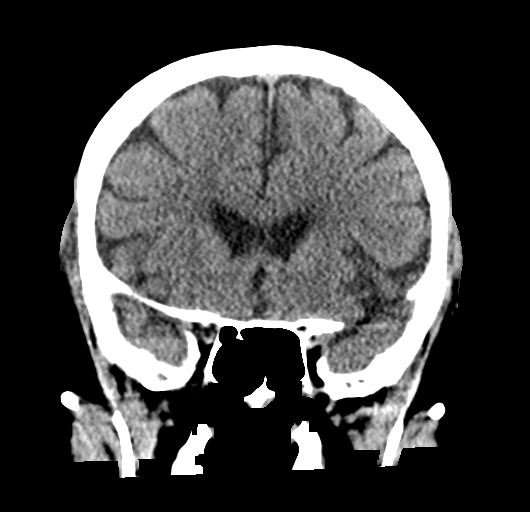

[Series 9: sagittal soft tissue · sagittal · 0.31mm/px · 2 of 56 slices shown]
[im 19/56  brain]
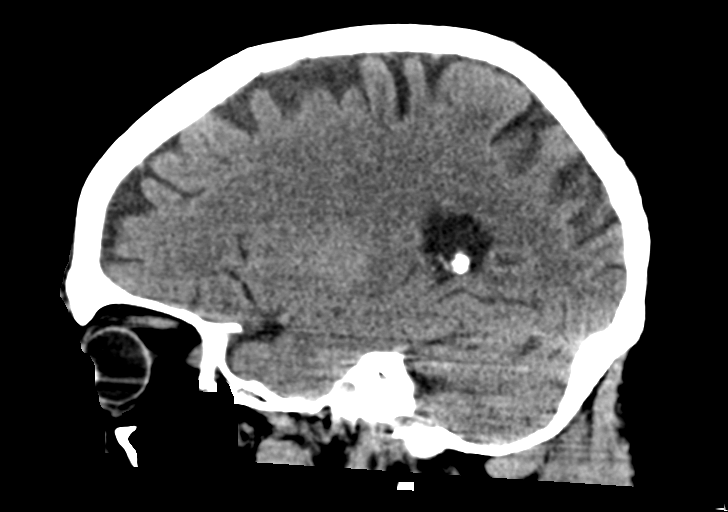
[im 37/56  brain]
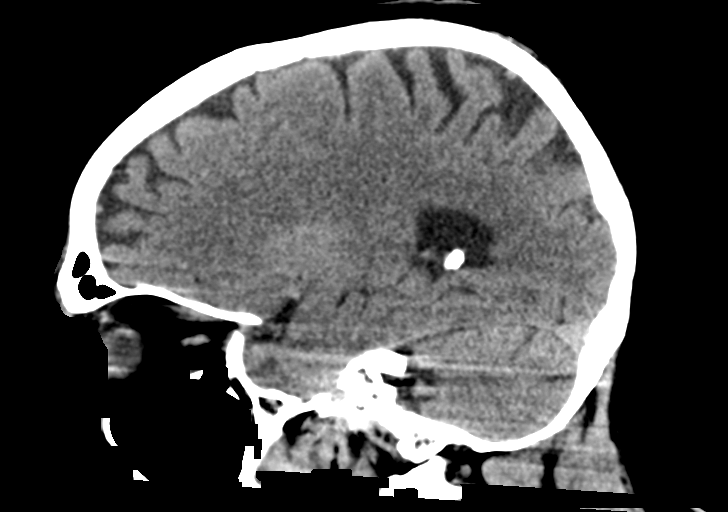

[Series 12: orthogonal bone · axial · 0.24mm/px · z∈[+126,+282]mm · 7 of 130 slices shown]
[im 9/130  bone]
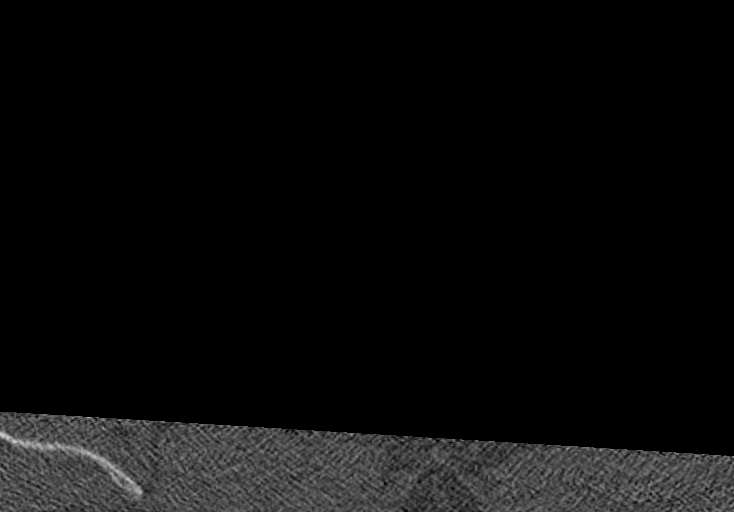
[im 26/130  bone]
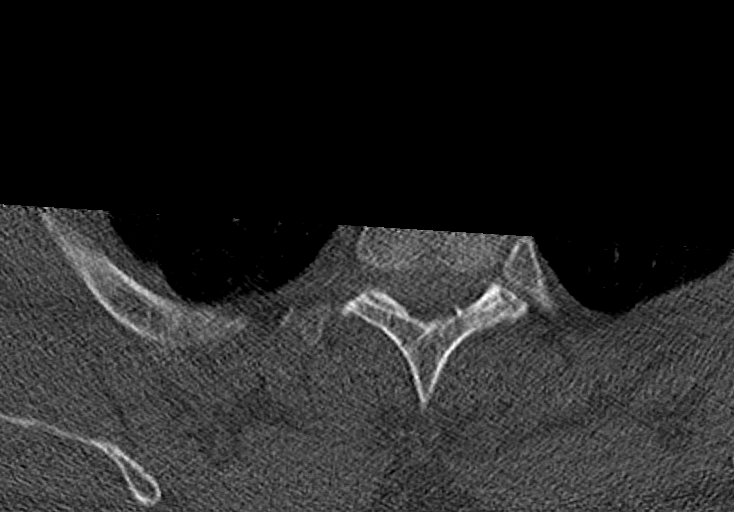
[im 44/130  bone]
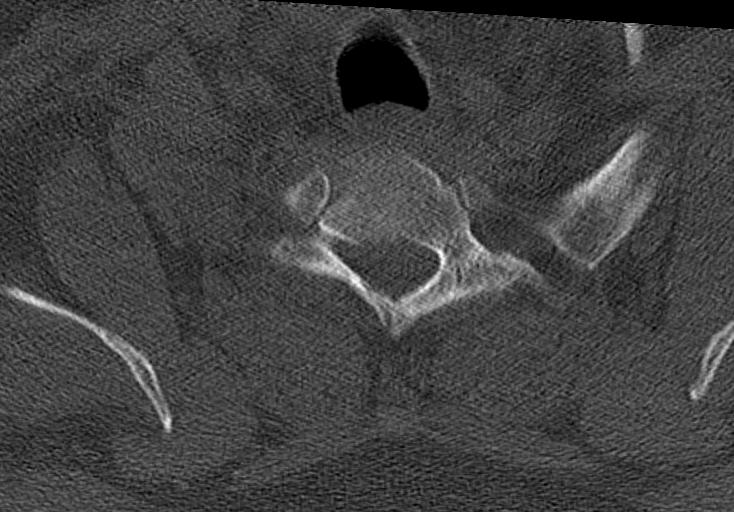
[im 61/130  bone]
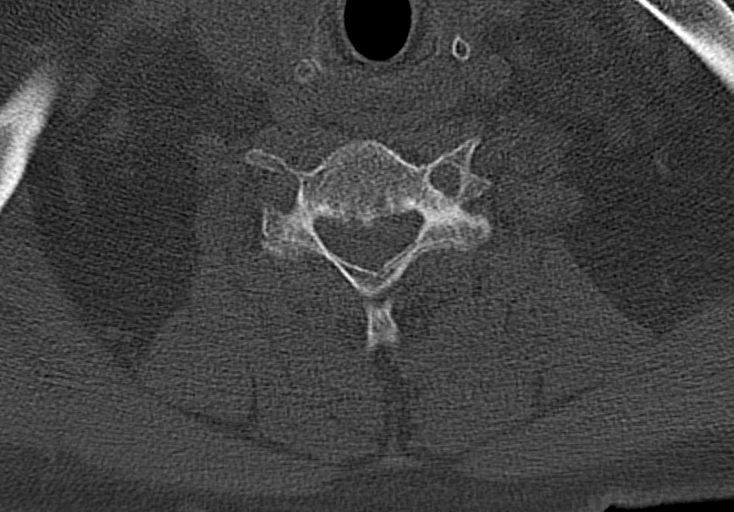
[im 69/130  bone]
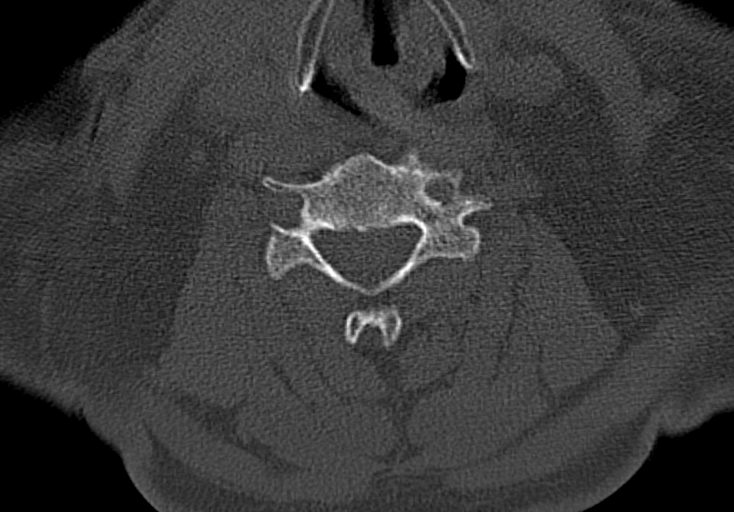
[im 87/130  bone]
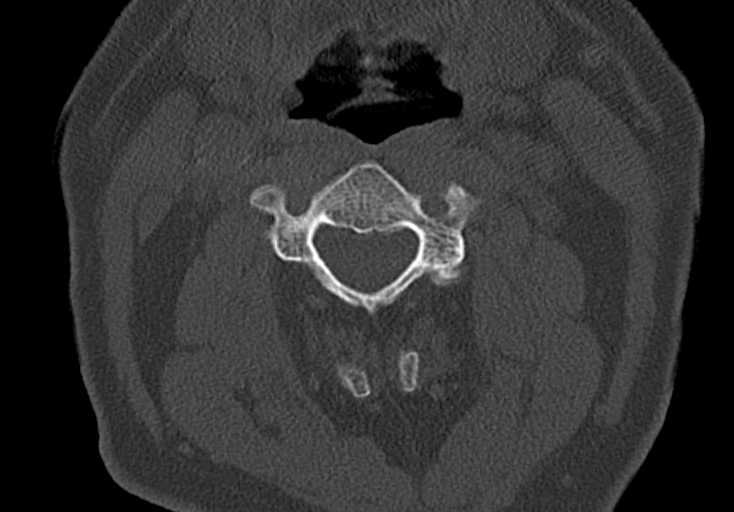
[im 104/130  bone]
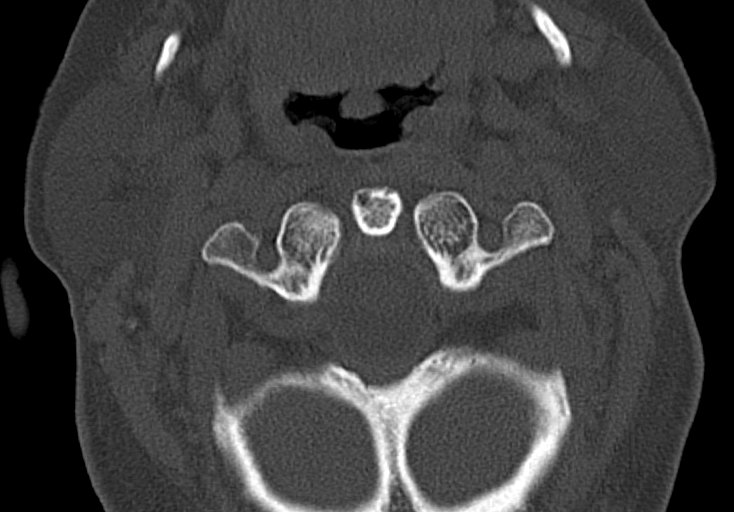

[14 of 47 positions shown; findings below may reference images not displayed]

FINDINGS: CT HEAD FINDINGS

Brain: Mild generalized parenchymal volume loss with commensurate
dilatation of the ventricles and sulci. Mild chronic small vessel
ischemic changes within the deep periventricular white matter
regions bilaterally.

No mass, hemorrhage, edema or other evidence of acute parenchymal
abnormality. No extra-axial hemorrhage.

Vascular: No hyperdense vessel or unexpected calcification.

Skull: Normal. Negative for fracture or focal lesion.

Sinuses/Orbits: No acute finding.

Other: None.

CT CERVICAL SPINE FINDINGS

Alignment: Mild dextroscoliosis. Slight reversal of the normal
cervical spine lordosis which is likely related to degenerative
spondylosis in the lower cervical spine. No evidence of acute
vertebral body subluxation.

Skull base and vertebrae: No fracture line or displaced fracture
fragment seen. Facet joints are normally aligned throughout.

Soft tissues and spinal canal: No prevertebral fluid or swelling. No
visible canal hematoma.

Disc levels: Degenerative spondylosis at the C5-6 and C6-7 levels,
moderate in degree with associated disc space narrowings and osseous
spurring. Associated disc-osteophytic bulges causing mild to
moderate central canal stenoses. Additional degenerative hypertrophy
of the uncovertebral and facet joints causing moderate bilateral
neural foramen stenoses at C5-6 and C6-7 with possible associated
nerve root impingement.

Upper chest: Negative.

Other: Bilateral carotid atherosclerosis.
IMPRESSION: 1. No acute intracranial abnormality. No intracranial mass,
hemorrhage or edema. No skull fracture. Mild chronic small vessel
ischemic changes in the white matter.
2. No fracture or acute subluxation within the cervical spine.
Degenerative changes of the lower cervical spine, as detailed above.
3. Carotid atherosclerosis.

## 2022-03-24 ENCOUNTER — Telehealth: Payer: Self-pay | Admitting: Licensed Clinical Social Worker

## 2022-03-24 NOTE — Telephone Encounter (Signed)
Patient lvm for LCSW looking for mental health services LCSW provided information to patient on [facility]. Patient voiced appreciation.

## 2024-09-05 ENCOUNTER — Other Ambulatory Visit: Payer: Self-pay

## 2024-09-05 ENCOUNTER — Emergency Department
Admission: EM | Admit: 2024-09-05 | Discharge: 2024-09-06 | Disposition: A | Discharge status: Other Acute Inpatient Hospital | Source: Home / Self Care | Attending: Emergency Medicine | Admitting: Emergency Medicine

## 2024-09-05 DIAGNOSIS — R45851 Suicidal ideations: Secondary | ICD-10-CM | POA: Insufficient documentation

## 2024-09-05 DIAGNOSIS — F10120 Alcohol abuse with intoxication, uncomplicated: Secondary | ICD-10-CM | POA: Insufficient documentation

## 2024-09-05 DIAGNOSIS — F321 Major depressive disorder, single episode, moderate: Secondary | ICD-10-CM | POA: Insufficient documentation

## 2024-09-05 DIAGNOSIS — F17210 Nicotine dependence, cigarettes, uncomplicated: Secondary | ICD-10-CM | POA: Insufficient documentation

## 2024-09-05 DIAGNOSIS — F10920 Alcohol use, unspecified with intoxication, uncomplicated: Secondary | ICD-10-CM

## 2024-09-05 DIAGNOSIS — F411 Generalized anxiety disorder: Secondary | ICD-10-CM | POA: Insufficient documentation

## 2024-09-05 DIAGNOSIS — Y906 Blood alcohol level of 120-199 mg/100 ml: Secondary | ICD-10-CM | POA: Insufficient documentation

## 2024-09-05 LAB — ACETAMINOPHEN LEVEL: Acetaminophen (Tylenol), Serum: <10 ug/mL — ABNORMAL LOW (ref 10–30)

## 2024-09-05 LAB — CBC
HCT: 40.7 % (ref 39.0–52.0)
Hemoglobin: 13.8 g/dL (ref 13.0–17.0)
MCH: 31.7 pg (ref 26.0–34.0)
MCHC: 33.9 g/dL (ref 30.0–36.0)
MCV: 93.6 fL (ref 80.0–100.0)
Platelets: 97 K/uL — ABNORMAL LOW (ref 150–400)
RBC: 4.35 MIL/uL (ref 4.22–5.81)
RDW: 13 % (ref 11.5–15.5)
WBC: 5.7 K/uL (ref 4.0–10.5)
nRBC: 0 % (ref 0.0–0.2)

## 2024-09-05 LAB — ETHANOL: Alcohol, Ethyl (B): 147 mg/dL — ABNORMAL HIGH

## 2024-09-05 LAB — URINE DRUG SCREEN
Amphetamines: NEGATIVE
Barbiturates: NEGATIVE
Benzodiazepines: NEGATIVE
Cocaine: NEGATIVE
Fentanyl: NEGATIVE
Methadone Scn, Ur: NEGATIVE
Opiates: NEGATIVE
Tetrahydrocannabinol: NEGATIVE

## 2024-09-05 LAB — COMPREHENSIVE METABOLIC PANEL WITH GFR
ALT: 24 U/L (ref 0–44)
AST: 27 U/L (ref 15–41)
Albumin: 4 g/dL (ref 3.5–5.0)
Alkaline Phosphatase: 77 U/L (ref 38–126)
Anion gap: 13 (ref 5–15)
BUN: 14 mg/dL (ref 8–23)
CO2: 21 mmol/L — ABNORMAL LOW (ref 22–32)
Calcium: 8.9 mg/dL (ref 8.9–10.3)
Chloride: 103 mmol/L (ref 98–111)
Creatinine, Ser: 0.78 mg/dL (ref 0.61–1.24)
GFR, Estimated: >60 mL/min
Glucose, Bld: 92 mg/dL (ref 70–99)
Potassium: 3.5 mmol/L (ref 3.5–5.1)
Sodium: 137 mmol/L (ref 135–145)
Total Bilirubin: 0.4 mg/dL (ref 0.0–1.2)
Total Protein: 6.8 g/dL (ref 6.5–8.1)

## 2024-09-05 LAB — SALICYLATE LEVEL: Salicylate Lvl: <7 mg/dL — ABNORMAL LOW (ref 7.0–30.0)

## 2024-09-05 MED ORDER — LORAZEPAM 2 MG/ML IJ SOLN: 1.0000 mg | INTRAMUSCULAR | Status: DC | PRN

## 2024-09-05 MED ORDER — FOLIC ACID 1 MG PO TABS
1.0000 mg | ORAL_TABLET | Freq: Every day | ORAL | Status: DC
  Administered 2024-09-06: 1 mg via ORAL
  Filled 2024-09-05: qty 1

## 2024-09-05 MED ORDER — LORAZEPAM 1 MG PO TABS: 1.0000 mg | ORAL_TABLET | ORAL | Status: DC | PRN

## 2024-09-05 MED ORDER — THIAMINE MONONITRATE 100 MG PO TABS
100.0000 mg | ORAL_TABLET | Freq: Every day | ORAL | Status: DC
  Administered 2024-09-06: 100 mg via ORAL
  Filled 2024-09-05: qty 1

## 2024-09-05 MED ORDER — ADULT MULTIVITAMIN W/MINERALS CH
1.0000 | ORAL_TABLET | Freq: Every day | ORAL | Status: DC
  Administered 2024-09-06: 1 via ORAL
  Filled 2024-09-05: qty 1

## 2024-09-05 MED ORDER — THIAMINE HCL 100 MG/ML IJ SOLN
100.0000 mg | Freq: Every day | INTRAMUSCULAR | Status: DC
  Filled 2024-09-05: qty 1

## 2024-09-05 NOTE — ED Notes (Addendum)
 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

## 2024-09-05 NOTE — ED Provider Notes (Signed)
 "  [facility] Provider Note    Event Date/Time   First MD Initiated Contact with Patient 09/05/24 2207     (approximate)   History   Psychiatric Evaluation (/)   HPI  Nicholas Durham is a 64 y.o. male patient with a past medical history significant for alcohol abuse presents to the emergency department with suicidal ideation.  Patient arrived with involuntary commitment paperwork in place.  States that he has thoughts of killing himself and the world would be better if he was not here.  Does endorse drinking alcohol earlier today.  Last drink of alcohol was just prior to arrival.  States that he would kill himself but he does not have a gun.  States that he is withdrawing from alcohol in the past.       Physical Exam   Triage Vital Signs: ED Triage Vitals  Encounter Vitals Group     BP 09/05/24 2025 117/72     Girls Systolic BP Percentile --      Girls Diastolic BP Percentile --      Boys Systolic BP Percentile --      Boys Diastolic BP Percentile --      Pulse Rate 09/05/24 2025 81     Resp 09/05/24 2025 20     Temp 09/05/24 2025 98.5 F (36.9 C)     Temp src --      SpO2 09/05/24 2025 95 %     Weight 09/05/24 2023 250 lb (113.4 kg)     Height 09/05/24 2023 6' 1 (1.854 m)     Head Circumference --      Peak Flow --      Pain Score 09/05/24 2023 0     Pain Loc --      Pain Education --      Exclude from Growth Chart --     Most recent vital signs: Vitals:   09/05/24 2025 09/05/24 2216  BP: 117/72 117/72  Pulse: 81 81  Resp: 20   Temp: 98.5 F (36.9 C)   SpO2: 95%     Physical Exam Constitutional:      Appearance: He is well-developed.  HENT:     Head: Atraumatic.  Eyes:     Conjunctiva/sclera: Conjunctivae normal.  Cardiovascular:     Rate and Rhythm: Regular rhythm.  Pulmonary:     Effort: No respiratory distress.  Musculoskeletal:     Cervical back: Normal range of motion.  Skin:    General: Skin is warm.  Neurological:      Mental Status: He is alert. Mental status is at baseline.  Psychiatric:        Mood and Affect: Mood is anxious.        Behavior: Behavior is cooperative.        Thought Content: Thought content includes suicidal ideation. Thought content does not include homicidal ideation. Thought content includes suicidal plan. Thought content does not include homicidal plan.     Comments: Slurring his words     IMPRESSION / MDM / ASSESSMENT AND PLAN / ED COURSE  I reviewed the triage vital signs and the nursing notes.  Differential diagnosis including suicidal ideation, acute intoxication, alcohol withdrawal, electrolyte abnormality  CIWA score of 2  CIWA orders placed  Upheld involuntary commitment paperwork    LABS (all labs ordered are listed, but only abnormal results are displayed) Labs interpreted as -    Labs Reviewed  COMPREHENSIVE METABOLIC PANEL WITH GFR - Abnormal;  Notable for the following components:      Result Value   CO2 21 (*)    All other components within normal limits  ETHANOL - Abnormal; Notable for the following components:   Alcohol, Ethyl (B) 147 (*)    All other components within normal limits  CBC - Abnormal; Notable for the following components:   Platelets 97 (*)    All other components within normal limits  SALICYLATE LEVEL - Abnormal; Notable for the following components:   Salicylate Lvl <7.0 (*)    All other components within normal limits  ACETAMINOPHEN  LEVEL - Abnormal; Notable for the following components:   Acetaminophen  (Tylenol ), Serum <10 (*)    All other components within normal limits  URINE DRUG SCREEN    MDM  Patient presents to the emergency department with acute intoxication.  Alcohol level is 147.  Suicidal ideation without a clear plan.  No significant electrolyte abnormality.  Thrombocytopenia likely in the setting of chronic alcohol use  CIWA score of 2.  CIWA order set applied.  Upheld IVC paperwork with plans for  psychiatric consultation.  Ordered thiamine  and folic acid .  Medically cleared.     The patient has been placed in psychiatric observation due to the need to provide a safe environment for the patient while obtaining psychiatric consultation and evaluation, as well as ongoing medical and medication management to treat the patient's condition.  The patient has been placed under full IVC at this time.   PROCEDURES:  Critical Care performed: No  Procedures  Patient's presentation is most consistent with acute presentation with potential threat to life or bodily function.   MEDICATIONS ORDERED IN ED: Medications  LORazepam  (ATIVAN ) tablet 1-4 mg (has no administration in time range)    Or  LORazepam  (ATIVAN ) injection 1-4 mg (has no administration in time range)  thiamine  (VITAMIN B1) tablet 100 mg (has no administration in time range)    Or  thiamine  (VITAMIN B1) injection 100 mg (has no administration in time range)  folic acid  (FOLVITE ) tablet 1 mg (has no administration in time range)  multivitamin with minerals tablet 1 tablet (has no administration in time range)    FINAL CLINICAL IMPRESSION(S) / ED DIAGNOSES   Final diagnoses:  Alcoholic intoxication without complication  Suicidal ideation     Rx / DC Orders   ED Discharge Orders     None        Note:  This document was prepared using Dragon voice recognition software and may include unintentional dictation errors.   Suzanne Kirsch, MD 09/05/24 2229  "

## 2024-09-05 NOTE — ED Triage Notes (Signed)
 Pt to ED via [facility], pt under IVC. Pt reports he is having thoughts of wanting to harm himself but does not have the means to do so (gun). Pt has been drinking excessively over past few days, pt is known alcoholic. Pt reports he doesn't drink daily but will go on a binge. Pt denies withdrawing from alcohol in past. Pt reports drinking 3 large beers today. Pt is calm and cooperative

## 2024-09-06 ENCOUNTER — Inpatient Hospital Stay
Admission: AD | Admit: 2024-09-06 | Discharge: 2024-09-14 | DRG: 881 | Disposition: A | Discharge status: Home / Self Care | Source: Intra-hospital | Attending: Psychiatry | Admitting: Psychiatry

## 2024-09-06 DIAGNOSIS — F17210 Nicotine dependence, cigarettes, uncomplicated: Secondary | ICD-10-CM | POA: Diagnosis present

## 2024-09-06 DIAGNOSIS — Z634 Disappearance and death of family member: Secondary | ICD-10-CM

## 2024-09-06 DIAGNOSIS — F419 Anxiety disorder, unspecified: Secondary | ICD-10-CM | POA: Diagnosis present

## 2024-09-06 DIAGNOSIS — Z59868 Other specified financial insecurity: Secondary | ICD-10-CM | POA: Diagnosis not present

## 2024-09-06 DIAGNOSIS — F329 Major depressive disorder, single episode, unspecified: Principal | ICD-10-CM | POA: Diagnosis present

## 2024-09-06 DIAGNOSIS — F10120 Alcohol abuse with intoxication, uncomplicated: Secondary | ICD-10-CM | POA: Diagnosis present

## 2024-09-06 DIAGNOSIS — Z23 Encounter for immunization: Secondary | ICD-10-CM | POA: Diagnosis not present

## 2024-09-06 DIAGNOSIS — D6959 Other secondary thrombocytopenia: Secondary | ICD-10-CM | POA: Diagnosis present

## 2024-09-06 DIAGNOSIS — Y906 Blood alcohol level of 120-199 mg/100 ml: Secondary | ICD-10-CM | POA: Diagnosis present

## 2024-09-06 DIAGNOSIS — R45851 Suicidal ideations: Secondary | ICD-10-CM | POA: Diagnosis present

## 2024-09-06 DIAGNOSIS — Z79899 Other long term (current) drug therapy: Secondary | ICD-10-CM

## 2024-09-06 DIAGNOSIS — Z653 Problems related to other legal circumstances: Secondary | ICD-10-CM | POA: Diagnosis not present

## 2024-09-06 DIAGNOSIS — G4700 Insomnia, unspecified: Secondary | ICD-10-CM | POA: Diagnosis present

## 2024-09-06 DIAGNOSIS — F411 Generalized anxiety disorder: Secondary | ICD-10-CM | POA: Diagnosis present

## 2024-09-06 MED ORDER — FOLIC ACID 1 MG PO TABS
1.0000 mg | ORAL_TABLET | Freq: Every day | ORAL | Status: DC
  Administered 2024-09-07 – 2024-09-14 (×8): 1 mg via ORAL
  Filled 2024-09-06 (×8): qty 1

## 2024-09-06 MED ORDER — ADULT MULTIVITAMIN W/MINERALS CH
1.0000 | ORAL_TABLET | Freq: Every day | ORAL | Status: DC
  Administered 2024-09-07 – 2024-09-14 (×8): 1 via ORAL
  Filled 2024-09-06 (×8): qty 1

## 2024-09-06 MED ORDER — FLUOXETINE HCL 20 MG PO CAPS
20.0000 mg | ORAL_CAPSULE | Freq: Every day | ORAL | Status: DC
  Administered 2024-09-07 – 2024-09-14 (×8): 20 mg via ORAL
  Filled 2024-09-06 (×8): qty 1

## 2024-09-06 MED ORDER — ALUM & MAG HYDROXIDE-SIMETH 200-200-20 MG/5ML PO SUSP: 30.0000 mL | ORAL | Status: DC | PRN

## 2024-09-06 MED ORDER — THIAMINE MONONITRATE 100 MG PO TABS
100.0000 mg | ORAL_TABLET | Freq: Every day | ORAL | Status: DC
  Administered 2024-09-07 – 2024-09-14 (×8): 100 mg via ORAL
  Filled 2024-09-06 (×8): qty 1

## 2024-09-06 MED ORDER — ACETAMINOPHEN 325 MG PO TABS
650.0000 mg | ORAL_TABLET | Freq: Four times a day (QID) | ORAL | Status: DC | PRN
  Administered 2024-09-06 – 2024-09-14 (×20): 650 mg via ORAL
  Filled 2024-09-06 (×21): qty 2

## 2024-09-06 MED ORDER — THIAMINE HCL 100 MG/ML IJ SOLN
100.0000 mg | Freq: Every day | INTRAMUSCULAR | Status: DC
  Filled 2024-09-06: qty 2

## 2024-09-06 MED ORDER — ACETAMINOPHEN 325 MG PO TABS
650.0000 mg | ORAL_TABLET | Freq: Once | ORAL | Status: AC
  Administered 2024-09-06: 650 mg via ORAL

## 2024-09-06 MED ORDER — LORAZEPAM 2 MG/ML IJ SOLN: 1.0000 mg | INTRAMUSCULAR | Status: AC | PRN

## 2024-09-06 MED ORDER — OLANZAPINE 5 MG PO TABS: 5.0000 mg | ORAL_TABLET | Freq: Three times a day (TID) | ORAL | Status: DC | PRN

## 2024-09-06 MED ORDER — OLANZAPINE 5 MG PO TABS
5.0000 mg | ORAL_TABLET | Freq: Three times a day (TID) | ORAL | Status: DC | PRN
  Administered 2024-09-08 – 2024-09-13 (×3): 5 mg via ORAL
  Filled 2024-09-06 (×4): qty 1

## 2024-09-06 MED ORDER — MAGNESIUM HYDROXIDE 400 MG/5ML PO SUSP: 30.0000 mL | Freq: Every day | ORAL | Status: DC | PRN

## 2024-09-06 MED ORDER — FLUOXETINE HCL 20 MG PO CAPS
20.0000 mg | ORAL_CAPSULE | Freq: Every day | ORAL | Status: DC
  Administered 2024-09-06: 20 mg via ORAL
  Filled 2024-09-06: qty 1

## 2024-09-06 MED ORDER — LORAZEPAM 1 MG PO TABS: 1.0000 mg | ORAL_TABLET | ORAL | Status: AC | PRN

## 2024-09-06 MED ORDER — HYDROXYZINE HCL 25 MG PO TABS: 50.0000 mg | ORAL_TABLET | Freq: Three times a day (TID) | ORAL | Status: DC | PRN

## 2024-09-06 MED ORDER — OLANZAPINE 10 MG IM SOLR: 5.0000 mg | Freq: Three times a day (TID) | INTRAMUSCULAR | Status: DC | PRN

## 2024-09-06 NOTE — BH Assessment (Signed)
 Comprehensive Clinical Assessment (CCA) Screening, Triage and Referral Note  09/06/2024 Nicholas Durham [id number] Recommendations for Services/Supports/Treatments: [place]ris consult/Disposition pending. Nicholas Durham is a 64 year old, English speaking, Caucasian male. Pt presented to [facility] ED under IVC. Per triage note: Pt to ED via [facility], pt under IVC. Pt reports he is having thoughts of wanting to harm himself but does not have the means to do so (gun). Pt has been drinking excessively over past few days, pt is known alcoholic. Pt reports he doesn't drink daily but will go on a binge. Pt denies withdrawing from alcohol in past. Pt reports drinking 3 large beers today. Pt is calm and cooperative  On assessment the pt. admitted that he drinks about once per week; however, he blames his decision to drink on his conflict with his son and his daughter-in-law. The pt reported that his daughter in-law accused him of drinking and driving and antagonizes him constantly; pt denies claims. Pt reported that he is on parole and was released on 02/25/24 after serving 10 months in prison, going back to prison 04/27/24 due to parole violation, and was released again on 07/27/24. The pt. admitted to having a drinking problem. Pt experiencing feelings of intense sadness and loneliness explaining that he lost his wife in 2010 and has not pursued any relationships since the wife's passing. Pt denied having withdrawal symptoms that include tremors and hallucinations. The pt. had fair insight and impaired judgement. Pt was in the action stage of change, as he reports being connected to the [facility] program through [facility]. The pt. is prescribed medications and reported that he is compliant. Pt was not responding to internal/external stimuli. Pt's thoughts were linear. Pt was oriented x4. Pt presented with a depressed mood; affect was congruent. Patient presents in a dorsal vagal autonomic state, evidenced by hypoarousal and shutdown behaviors such as  flat affect, slowed speech, minimal eye contact, psychomotor retardation, and withdrawal from interaction. Pt had a disheveled appearance. Pt's BAL was 147 on arrival. Pt reported having 1 24 oz beer prior to arrival and minimized his drinking by explaining that he does not drink liquor nor daily. Pt denied current SI/HI/AV/H. Pt admitted that he's thought about suicide but he'd never do it.  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 No data recorded What Is the Reason for Your Visit/Call Today? No data recorded How Long Has This Been Causing You Problems? No data recorded What Do You Feel Would Help You the Most Today? No data recorded  Have You Recently Had Any Thoughts About Hurting Yourself? No data recorded Are You Planning to Commit Suicide/Harm Yourself At This time? No data recorded  Have you Recently Had Thoughts About Hurting Someone Sherral? No data recorded Are You Planning to Harm Someone at This Time? No data recorded Explanation: No data recorded  Have You Used Any Alcohol or Drugs in the Past 24 Hours? No data recorded How Long Ago Did You Use Drugs or Alcohol? No data recorded What Did You Use and How Much? No data recorded  Do You Currently Have a Therapist/Psychiatrist? No data recorded Name of Therapist/Psychiatrist: No data recorded  Have You Been Recently Discharged From Any Office Practice or Programs? No data recorded Explanation of Discharge From Practice/Program: No data recorded   CCA Screening Triage Referral Assessment Type of Contact: No data  recorded Telemedicine Service Delivery:   Is this Initial or Reassessment?   Date Telepsych consult ordered in [facility]:    Time Telepsych consult ordered in [facility]:    Location of Assessment: No data recorded Provider Location: No  data recorded   Collateral Involvement: No data recorded  Does Patient Have a Court Appointed Legal Guardian? No data recorded Name and Contact of Legal Guardian: No data recorded If Minor and Not Living with Parent(s), Who has Custody? No data recorded Is CPS involved or ever been involved? No data recorded Is APS involved or ever been involved? No data recorded  Patient Determined To Be At Risk for Harm To Self or Others Based on Review of Patient Reported Information or Presenting Complaint? No data recorded Method: No data recorded Availability of Means: No data recorded Intent: No data recorded Notification Required: No data recorded Additional Information for Danger to Others Potential: No data recorded Additional Comments for Danger to Others Potential: No data recorded Are There Guns or Other Weapons in Your Home? No data recorded Types of Guns/Weapons: No data recorded Are These Weapons Safely Secured?                            No data recorded Who Could Verify You Are Able To Have These Secured: No data recorded Do You Have any Outstanding Charges, Pending Court Dates, Parole/Probation? No data recorded Contacted To Inform of Risk of Harm To Self or Others: No data recorded  Does Patient Present under Involuntary Commitment? No data recorded   [place] of Residence: No data recorded  Patient Currently Receiving the Following Services: No data recorded  Determination of Need: No data recorded  Options For Referral: No data recorded  Disposition Recommendation per psychiatric provider: Pending psych consult.   Javed Cotto R Terreon Ekholm, LCAS

## 2024-09-06 NOTE — ED Notes (Signed)
 Nurse Leonor in room with patient at this time

## 2024-09-06 NOTE — ED Notes (Signed)
Patient in assigned room.

## 2024-09-06 NOTE — Consult Note (Signed)
 [facility] Psychiatric Consult Initial  Patient Name: .Nicholas Durham  MRN: [id number]  DOB: 10-06-1960  Consult Order details:  Orders (From admission, onward)     Start     Ordered   09/05/24 2220  CONSULT TO CALL ACT TEAM       Ordering Provider: Suzanne Kirsch, MD  Provider:  (Not yet assigned)  Question:  Reason for Consult?  Answer:  Psych consult   09/05/24 2220   09/05/24 2220  IP CONSULT TO PSYCHIATRY       Ordering Provider: Suzanne Kirsch, MD  Provider:  (Not yet assigned)  Question:  Reason for consult:  Answer:  Medication management   09/05/24 2220             Mode of Visit: Tele-visit Virtual Statement:TELE PSYCHIATRY ATTESTATION & CONSENT As the provider for this telehealth consult, I attest that I verified the patient's identity using two separate identifiers, introduced myself to the patient, provided my credentials, disclosed my location, and performed this encounter via a HIPAA-compliant, real-time, face-to-face, two-way, interactive audio and video platform and with the full consent and agreement of the patient (or guardian as applicable.) Patient physical location: ED. Telehealth provider physical location: home office in state of [place] .   Video start time: 1045 am Video end time: 1115 am    Psychiatry Consult Evaluation  Service Date: September 06, 2024 LOS:  LOS: 0 days  Chief Complaint my daughter n law got into me about losing keys. She called the cops and they told me to come here. I have said that I would kill myself if I had a gun but I dont have one but if someone put a gun next ot me miraculously I would kill myself. It has been terrible every decision I make is terrible  Primary Psychiatric Diagnoses  Alcohol abuse 2.  MDD 3.  GAD  Assessment  Hyder Viernes is a 64 y.o. male admitted: Presented to the EDfor 09/05/2024 11:44 PM for alcohol intoxication, depression and suicidal ideation. He carries the psychiatric diagnoses of alcohol abuse and MDD  and has a past medical history of  alcohol abuse, MDD.   His current presentation of depression sadness and alcohol intoxication is most consistent with MDD, GAD, Alcohol abuse. He meets criteria for IVC based on alcohol abuse, severe depression and suicidal ideations.  Current outpatient psychotropic medications include wellbutrin , prozac , and hydroxyzine  and historically he has had a good  response to these medications per his recollection.  He was inconsistently compliant with medications prior to admission as evidenced by his own account of missing some days. On initial examination, patient was emotional irritable admits to sadness and depression. He admits to not wanting to live and having suicidal ideations and that he would use a gun if one was presented to him but denies access to weapons so says he wont be able to act on the thoughts. Please see plan below for detailed recommendations.   Diagnoses:  Active Hospital problems: Active Problems:   * No active hospital problems. *    Plan   ## Psychiatric Medication Recommendations:  Resume Prozac  20mg  every day Hydroxyzine  50mg  po q8hr prn anxiety Olanzapine   5mg  po or IM q12hr prn psychosis or [id number]     ## Medical Decision Making Capacity: Not specifically addressed in this encounter  ## Further Work-up:  --  While pt on Qtc prolonging medications, please monitor & replete K+ to 4 and Mg2+ to 2 -- most recent EKG on 01/17/2019  had QtC of Afib -- Pertinent labwork reviewed earlier this admission includes: BAL 147,UDS negative   ## Disposition:-- We recommend inpatient psychiatric hospitalization after medical hospitalization. Patient has been involuntarily committed on 09/05/2024.   ## Behavioral / Environmental: - No specific recommendations at this time.     ## Safety and Observation Level:  - Based on my clinical evaluation, I estimate the patient to be at high risk of self harm in the current  setting. - At this time, we recommend  routine. This decision is based on my review of the chart including patient's history and current presentation, interview of the patient, mental status examination, and consideration of suicide risk including evaluating suicidal ideation, plan, intent, suicidal or self-harm behaviors, risk factors, and protective factors. This judgment is based on our ability to directly address suicide risk, implement suicide prevention strategies, and develop a safety plan while the patient is in the clinical setting. Please contact our team if there is a concern that risk level has changed.  CSSR Risk Category:C-SSRS RISK CATEGORY: Moderate Risk  Suicide Risk Assessment: Patient has following modifiable risk factors for suicide: active suicidal ideation, under treated depression , social isolation, recklessness, triggering events, and recent loss (death, isolation, vocation), which we are addressing by medication management and inpatient psychiatry. Patient has following non-modifiable or demographic risk factors for suicide: male gender and early widowhood Patient has the following protective factors against suicide: Access to outpatient mental health care and Pets in the home  Thank you for this consult request. Recommendations have been communicated to the primary team.  We will sign off at this time. [phone]   Jannett ONEIDA Castillo, NP       History of Present Illness  Relevant Aspects of Hospital ED Course:  Admitted on 09/05/2024 for alcohol abuse and suicidal ideation. They have been drinking with only about 11 days of sobriety in years. Has been grieving the death of his wife for the past 15 years. Has been in and out of prison for violating due to drinking excessively and DUI. 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 He goes to [facility] program and reports that he is prescribed medication for  depression and anxiety.    Patient Report:  I do everything wrong. I am very depressed. I say a lot of times that I would shoot myself   Psych ROS:  Depression: sadness, isolation, suicidal ideations, hopelessness, substance abuse Anxiety:  worry stress fear Mania (lifetime and current): denies Psychosis: (lifetime and current): denies    Review of Systems  Psychiatric/Behavioral:  Positive for depression, substance abuse and suicidal ideas.   All other systems reviewed and are negative.    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 Legal Hx: currently on parole  Living Situation: lives in basement of his parents home Spiritual Hx: denies Access to weapons/lethal means: no access   Substance History Alcohol: yes  Type of alcohol beer Last Drink yesterday Number  of drinks per day 2 tall boys History of alcohol withdrawal seizures denies History of DT's yes Tobacco: yes cigarettes Illicit drugs: denies Prescription drug abuse: denies Rehab hx: yes  Exam Findings  Physical Exam: reviewed Vital Signs:  Temp:  [98 F (36.7 C)-98.5 F (36.9 C)] 98 F (36.7 C) (01/14 0816) Pulse Rate:  [80-86] 80 (01/14 0816) Resp:  [18-20] 18 (01/14 0816) BP: (117-141)/(72-87) 141/87 (01/14 0816) SpO2:  [95 %] 95 % (01/14 0816) Weight:  [113.4 kg] 113.4 kg (01/13 2023) Blood pressure (!) 141/87, pulse 80, temperature 98 F (36.7 C), temperature  source Oral, resp. rate 18, height 6' 1 (1.854 m), weight 113.4 kg, SpO2 95%. Body mass index is 32.98 kg/m.  Physical Exam Vitals and nursing note reviewed.     Mental Status Exam: General Appearance: Disheveled  Orientation:  Full (Time, Place, and Person)  Memory:  Immediate;   Good Recent;   Good  Concentration:  Concentration: Good  Recall:  Fair  Attention  Fair  Eye Contact:  Fair  Speech:  Clear and Coherent  Language:  Good  Volume:  Normal  Mood: depressed sad anxious  Affect:  Flat  Thought Process:  Coherent  Thought Content:  Negative  Suicidal Thoughts:  Yes.  without intent/plan  Homicidal Thoughts:  No  Judgement:  Impaired  Insight:  Fair  Psychomotor Activity:  Normal  Akathisia:  No  Fund of Knowledge:  Fair      Assets:  Housing Transportation  Cognition:  WNL  ADL's:  Intact  AIMS (if indicated):        Other History   These have been pulled in through the EMR, reviewed, and updated if appropriate.  Family History:  The patient's family history is not on file.  Medical History: Past Medical History:  Diagnosis Date   ETOH abuse     Surgical History: Past Surgical History:  Procedure Laterality Date   APPENDECTOMY       Medications:  Current Medications[1]  Allergies: Allergies[2]  Jannett ONEIDA Castillo, NP     [1]  Current Facility-Administered Medications:    folic acid  (FOLVITE ) tablet 1 mg, 1 mg, Oral, Daily, Mumma, Clotilda, MD   LORazepam  (ATIVAN ) tablet 1-4 mg, 1-4 mg, Oral, Q1H PRN **OR** LORazepam  (ATIVAN ) injection 1-4 mg, 1-4 mg, Intravenous, Q1H PRN, Mumma, Shannon, MD   multivitamin with minerals tablet 1 tablet, 1 tablet, Oral, Daily, Mumma, Clotilda, MD   thiamine  (VITAMIN B1) tablet 100 mg, 100 mg, Oral, Daily **OR** thiamine  (VITAMIN B1) injection 100 mg, 100 mg, Intravenous, Daily, Mumma, Shannon, MD  Current Outpatient Medications:    folic acid  (FOLVITE ) 1 MG tablet, Take 1 tablet (1 mg total) by mouth daily.,  Disp: 30 tablet, Rfl: 0   hydrOXYzine  (ATARAX ) 25 MG tablet, Take 25 mg by mouth 3 (three) times daily., Disp: , Rfl:    Ibuprofen-diphenhydrAMINE HCl 200-25 MG CAPS, Take 1-2 capsules by mouth at bedtime as needed (sleep)., Disp: , Rfl:    Multiple Vitamin (MULTIVITAMIN WITH MINERALS) TABS tablet, Take 1 tablet by mouth daily., Disp: 90 tablet, Rfl: 0   nicotine  (NICODERM CQ  - DOSED IN MG/24 HOURS) 21 mg/24hr patch, Place 1 patch (21 mg total) onto the skin daily., Disp: 28 patch, Rfl: 0   thiamine  100 MG tablet, Take 1 tablet (100 mg total) by mouth daily., Disp: 30 tablet, Rfl: 0 [2] No Known Allergies

## 2024-09-06 NOTE — Progress Notes (Signed)
 Patient has been accepted to [facility] BMU for 09/06/24 pending EKG. Patient assigned to room 316 Accepting physician is Dr. Jadapalle. Call report to [phone]. Representative was [facility] AC Linsey.   ER Staff is aware of it: [facility] ER Secretary Dr. Dicky ER MD Leonor, RN Patient's Nurse

## 2024-09-06 NOTE — Group Note (Signed)
 Date:  09/06/2024 Time:  8:26 PM  Group Topic/Focus:  Rediscovering Joy:   The focus of this group is to explore various ways to relieve stress in a positive manner.    Participation Level:  Active  Participation Quality:  Appropriate  Affect:  Appropriate  Cognitive:  Alert and Appropriate  Insight: Appropriate  Engagement in Group:  Limited  Modes of Intervention:  Clarification, Discussion, Rapport Building, and Support  Additional Comments:     Nicklaus Alviar 09/06/2024, 8:26 PM

## 2024-09-06 NOTE — ED Notes (Addendum)
 Pt transferred to BMU at this time. Pt transferred with tech and security at this time. Pt aware of admission and transfer. Pt calm and cooperative. All belongings sent with pt. Report has been called to Orlean Ellery PEAK.

## 2024-09-06 NOTE — ED Notes (Signed)
 TTS with pt in interview room

## 2024-09-06 NOTE — ED Notes (Signed)
 RN attempted to call report at this time.

## 2024-09-06 NOTE — ED Notes (Signed)
 Pt refused snack

## 2024-09-06 NOTE — ED Notes (Signed)
 IVC/Psych consult ordered/Pending/ Legal paperwork completed scanned into Epic

## 2024-09-06 NOTE — ED Provider Notes (Signed)
 Emergency Medicine Observation Re-evaluation Note   BP 120/75   Pulse 86   Temp 98.5 F (36.9 C)   Resp 20   Ht 6' 1 (1.854 m)   Wt 113.4 kg   SpO2 95%   BMI 32.98 kg/m    ED Course / MDM   No reported events during my shift at the time of this note.   Pt is awaiting dispo from consultants   Ginnie Shams MD    Shams Ginnie, MD 09/06/24 [phone]

## 2024-09-06 NOTE — ED Notes (Signed)
 Pt provided with breakfast tray.

## 2024-09-06 NOTE — ED Notes (Signed)
 Meal provided

## 2024-09-06 NOTE — ED Notes (Signed)
 Pt given warm blanket.

## 2024-09-06 NOTE — Progress Notes (Signed)
 Admission Note:  64 yr old Caucasian male who presents IVC in no acute distress for the treatment of SI, depression, ETOH abuse/intoxication Pt appears flat, irritable depressed. Pt was calm and cooperative with admission process. Pt denied SI and contracts for safety upon admission. Pt denies AVH .Pt says he has has chronic right knee pain and reported pain 8/10 and requested Tylenol . Pt skin was assessed and found to be clear of any abnormity apart from numerous tattoos  t torso and upper extremities PT searched and no contraband found, POC and unit policies explained and understanding verbalized. Consents obtained. Food and fluids offered, and fluids accepted. Pt had no additional questions or concerns

## 2024-09-07 DIAGNOSIS — F329 Major depressive disorder, single episode, unspecified: Secondary | ICD-10-CM | POA: Diagnosis not present

## 2024-09-07 MED ORDER — ARIPIPRAZOLE 2 MG PO TABS
2.0000 mg | ORAL_TABLET | Freq: Every day | ORAL | Status: DC
  Administered 2024-09-07 – 2024-09-09 (×3): 2 mg via ORAL
  Filled 2024-09-07 (×3): qty 1

## 2024-09-07 MED ORDER — BUPROPION HCL ER (XL) 150 MG PO TB24
150.0000 mg | ORAL_TABLET | Freq: Every day | ORAL | Status: DC
  Administered 2024-09-07 – 2024-09-14 (×8): 150 mg via ORAL
  Filled 2024-09-07 (×8): qty 1

## 2024-09-07 NOTE — Group Note (Signed)
 Date:  09/07/2024 Time:  10:11 AM  Group Topic/Focus:  Building Self Esteem:   The Focus of this group is helping patients become aware of the effects of self-esteem on their lives, the things they and others do that enhance or undermine their self-esteem, seeing the relationship between their level of self-esteem and the choices they make and learning ways to enhance self-esteem.    Participation Level:  Active  Participation Quality:  Appropriate  Affect:  Appropriate  Cognitive:  Appropriate  Insight: Appropriate  Engagement in Group:  Engaged  Modes of Intervention:  Activity  Additional Comments:    Nicholas Durham Nicholas Durham 09/07/2024, 10:11 AM

## 2024-09-07 NOTE — H&P (Signed)
 " Psychiatric Admission Assessment Adult  Patient Identification: Nicholas Durham MRN:  [id number] Date of Evaluation:  09/07/2024 Chief Complaint:  MDD (major depressive disorder) [F32.9]   History of Present Illness:   HISTORY OF PRESENT ILLNESS: The patient is a 64 year old male with a history of alcohol use disorder, GAD and MDD who was admitted to the inpatient psychiatric unit following Acute Suicidal Ideation and Severe Depression. The patient presented with worsening psychiatric symptoms in the setting of relapse on alcohol and legal concerns.  Patient reports he relapsed on alcohol on Tuesday after being triggered by his father needing to come to the hospital. He identifies his daughter-in-law was concerned so she brought him to the hospital. He endorses recent decrease in appetite and poor sleep.   Patient is dysphoric and sullen with minimal eye contact. He engages well, however remains guarded. He denies SI, HI, and AVH during assessment. He endorses having children who don't like me so he is not involved in their or his grandchildren's life. He reports his wife passed away years ago and then he moved back to [place] to live with his parents. He has several DUI and is not allowed to drink at all as per parole.   Regarding safety, the patient Denies SI. Homicidal ideation Denies HI. Due to safety concerns, the patient was admitted under Involuntary Admission.  The patient reports recent psychosocial stressors including relapse on alcohol, father being placed in hospital, and him potentially violating parole. Recent substance use includes Alcohol. There is not concern for withdrawal at this time as patient denies daily drinking and expressed 2 relapses over the last year. He reports engaging in SAIOP three times weekly to maintain sobriety.   Due to the severity of symptoms and concerns for safety, inpatient psychiatric admission was deemed necessary for stabilization, medication management,  and further evaluation.   Did the patient present with any abnormal findings indicating the need for additional neurological or psychological testing?  No  Total Time spent with patient: 1 hour Sleep  Sleep:Sleep: Poor  Past Psychiatric History:  Psychiatric History:  Information collected from Patient and chart review  Prev Dx/Sx: PTSD, MDD, Bipolar, GAD Current Psych Provider: Denies - previously went to [facility] Home Meds (current): Wellbutrin , Prozac , Hydroxyzine  Previous Med Trials: Haldol, Cogentin Therapy: SAIOP - [place] [facility])  Prior Psych Hospitalization: 1987 in [place] Prior Self Harm: Denies Prior Violence: Denies  Family Psych History: Denies Family Hx suicide: Denies  Social History:  Developmental Hx: Denies Educational Hx: Associates degree in Statistician Occupational Hx: Engineer, Structural to parents, 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 Yes.    Has the patient been a risk to self in the past 6 months? Yes.    Has the patient been a risk to self within the distant past? No.  Is the patient a risk to others? No.  Has the patient been a risk to others in the past 6 months? No.  Has the patient been a risk to others within the distant past? No.   Columbia Scale:  Flowsheet Row Admission (Current) from 09/06/2024 in [facility] INPATIENT BEHAVIORAL  MEDICINE ED from 09/05/2024 in [facility] Emergency Department at [address] [facility]  C-SSRS RISK CATEGORY Error: Q3, 4, or 5 should not be populated when Q2  is No Moderate Risk     Past Medical History:  Past Medical History:  Diagnosis Date   ETOH abuse     Past Surgical History:  Procedure Laterality Date   APPENDECTOMY     Family History: History reviewed. No pertinent family history.  Social History:  Social History   Substance and Sexual Activity  Alcohol Use Yes   Comment: Stopped drinking 01/12/19     Social History   Substance and Sexual Activity  Drug Use Yes   Types: Marijuana      Allergies:  Allergies[1] Lab Results:  Results for orders placed or performed during the hospital encounter of 09/05/24 (from the past 48 hours)  Comprehensive metabolic panel     Status: Abnormal   Collection Time: 09/05/24  9:28 PM  Result Value Ref Range   Sodium 137 135 - 145 mmol/L   Potassium 3.5 3.5 - 5.1 mmol/L   Chloride 103 98 - 111 mmol/L   CO2 21 (L) 22 - 32 mmol/L   Glucose, Bld 92 70 - 99 mg/dL    Comment: Glucose reference range applies only to samples taken after fasting for at least 8 hours.   BUN 14 8 - 23 mg/dL   Creatinine, Ser 9.21 0.61 - 1.24 mg/dL   Calcium 8.9 8.9 - 89.6 mg/dL   Total Protein 6.8 6.5 - 8.1 g/dL   Albumin 4.0 3.5 - 5.0 g/dL   AST 27 15 - 41 U/L   ALT 24 0 - 44 U/L   Alkaline Phosphatase 77 38 - 126 U/L   Total Bilirubin 0.4 0.0 - 1.2 mg/dL   GFR, Estimated >39 >39 mL/min    Comment: (NOTE) Calculated using the CKD-EPI Creatinine Equation (2021)    Anion gap 13 5 - 15    Comment: Performed at [facility], [address] [place], [place] [address]  Ethanol     Status: Abnormal   Collection Time: 09/05/24  9:28 PM  Result Value Ref Range   Alcohol, Ethyl (B) 147 (H) <15 mg/dL    Comment: (NOTE) For medical purposes only. Performed at [facility], [address] [place], [place] [address]   cbc     Status: Abnormal   Collection Time: 09/05/24  9:28 PM  Result Value Ref Range   WBC 5.7 4.0 - 10.5 K/uL   RBC 4.35 4.22 - 5.81 MIL/uL   Hemoglobin 13.8 13.0 -  17.0 g/dL   HCT 59.2 60.9 - 47.9 %   MCV 93.6 80.0 - 100.0 fL   MCH 31.7 26.0 - 34.0 pg   MCHC 33.9 30.0 - 36.0 g/dL   RDW 86.9 88.4 - 84.4 %   Platelets 97 (L) 150 - 400 K/uL    Comment: PLATELET COUNT CONFIRMED BY SMEAR Immature Platelet Fraction Cai Anfinson be clinically indicated, consider ordering this additional test [id number]    nRBC 0.0 0.0 - 0.2 %    Comment: Performed at [facility], [address] [place], [place] [address]  Salicylate level     Status: Abnormal   Collection Time: 09/05/24  9:28 PM  Result Value Ref Range   Salicylate Lvl <7.0 (L) 7.0 - 30.0 mg/dL    Comment: Performed at [facility], [address], [place], [place] [address]  Acetaminophen  level     Status:  Abnormal   Collection Time: 09/05/24  9:28 PM  Result Value Ref Range   Acetaminophen  (Tylenol ), Serum <10 (L) 10 - 30 ug/mL    Comment: (NOTE) Toxic concentrations can be more effectively related to post dose interval; >200, >100, and >50 ug/mL serum concentrations correspond to toxic concentrations at 4, 8, and 12 hours post dose, respectively.  Performed at [facility], [address], [place], [place] [address]     Blood Alcohol level:  Lab Results  Component Value Date   ETH 147 (H) 09/05/2024    Metabolic Disorder Labs:  No results found for: HGBA1C, MPG No results found for: PROLACTIN No results found for: CHOL, TRIG, HDL, CHOLHDL, VLDL, LDLCALC  Current Medications: Current Facility-Administered Medications  Medication Dose Route Frequency Provider Last Rate Last Admin   acetaminophen  (TYLENOL ) tablet 650 mg  650 mg Oral Q6H PRN Smith, Annie B, NP   650 mg at 09/07/24 1537   alum & mag hydroxide-simeth (MAALOX/MYLANTA) 200-200-20 MG/5ML suspension 30 mL  30 mL Oral Q4H PRN Smith, Annie B, NP       ARIPiprazole  (ABILIFY ) tablet 2 mg  2 mg Oral Daily Anais Denslow, NP   2 mg at 09/07/24 1535   buPROPion  (WELLBUTRIN  XL) 24 hr tablet 150  mg  150 mg Oral Daily Sahory Nordling, NP   150 mg at 09/07/24 1534   FLUoxetine  (PROZAC ) capsule 20 mg  20 mg Oral Daily Smith, Annie B, NP   20 mg at 09/07/24 0802   folic acid  (FOLVITE ) tablet 1 mg  1 mg Oral Daily Smith, Annie B, NP   1 mg at 09/07/24 0802   LORazepam  (ATIVAN ) tablet 1-4 mg  1-4 mg Oral Q1H PRN Smith, Annie B, NP       Or   LORazepam  (ATIVAN ) injection 1-4 mg  1-4 mg Intravenous Q1H PRN Smith, Annie B, NP       magnesium  hydroxide (MILK OF MAGNESIA) suspension 30 mL  30 mL Oral Daily PRN Smith, Annie B, NP       multivitamin with minerals tablet 1 tablet  1 tablet Oral Daily Smith, Annie B, NP   1 tablet at 09/07/24 0802   OLANZapine  (ZYPREXA ) injection 5 mg  5 mg Intramuscular TID PRN Smith, Annie B, NP       OLANZapine  (ZYPREXA ) tablet 5 mg  5 mg Oral TID PRN Smith, Annie B, NP       thiamine  (VITAMIN B1) tablet 100 mg  100 mg Oral Daily Smith, Annie B, NP   100 mg at 09/07/24 9197   Or   thiamine  (VITAMIN B1) injection 100 mg  100 mg Intravenous Daily Smith, Annie B, NP       PTA Medications: Medications Prior to Admission  Medication Sig Dispense Refill Last Dose/Taking   folic acid  (FOLVITE ) 1 MG tablet Take 1 tablet (1 mg total) by mouth daily. 30 tablet 0    hydrOXYzine  (ATARAX ) 25 MG tablet Take 25 mg by mouth 3 (three) times daily.      Ibuprofen-diphenhydrAMINE HCl 200-25 MG CAPS Take 1-2 capsules by mouth at bedtime as needed (sleep).      Multiple Vitamin (MULTIVITAMIN WITH MINERALS) TABS tablet Take 1 tablet by mouth daily. 90 tablet 0    nicotine  (NICODERM CQ  - DOSED IN MG/24 HOURS) 21 mg/24hr patch Place 1 patch (21 mg total) onto the skin daily. 28 patch 0    thiamine  100 MG tablet Take 1 tablet (100 mg total) by  mouth daily. 30 tablet 0     Psychiatric Specialty Exam:  Presentation  General Appearance: Disheveled  Eye Contact:Minimal  Speech:Clear and Coherent  Speech Volume:Decreased    Mood and Affect  Mood:Dysphoric;  Depressed  Affect:Depressed   Thought Process  Thought Processes:Coherent  Descriptions of Associations:Intact  Orientation:Full (Time, Place and Person)  Thought Content:Logical  Hallucinations:Hallucinations: None  Ideas of Reference:None  Suicidal Thoughts:Suicidal Thoughts: No  Homicidal Thoughts:Homicidal Thoughts: No   Sensorium  Memory:Immediate Fair; Recent Fair  Judgment:Poor  Insight:Fair   Executive Functions  Concentration:Fair  Attention Span:Fair  Recall:Fair  Fund of Knowledge:Fair  Language:Fair   Psychomotor Activity  Psychomotor Activity:Psychomotor Activity: Normal   Assets  Assets:Housing; Vocational/Educational    Musculoskeletal: Strength & Muscle Tone: within normal limits Gait & Station: normal  Physical Exam: Physical Exam Vitals and nursing note reviewed.  Constitutional:      Appearance: Normal appearance.  Pulmonary:     Effort: Pulmonary effort is normal.  Neurological:     Mental Status: He is alert and oriented to person, place, and time.  Psychiatric:        Mood and Affect: Mood normal.        Behavior: Behavior normal.        Thought Content: Thought content normal.    Review of Systems  Respiratory:  Negative for shortness of breath.   Cardiovascular:  Negative for chest pain.  Gastrointestinal:  Negative for diarrhea, nausea and vomiting.  Psychiatric/Behavioral:  Positive for substance abuse. Negative for depression, hallucinations and suicidal ideas. The patient is not nervous/anxious.   All other systems reviewed and are negative.  Blood pressure (!) 129/90, pulse 73, temperature (!) 97.4 F (36.3 C), temperature source Oral, resp. rate 16, SpO2 97%. There is no height or weight on file to calculate BMI.  Principal Diagnosis: MDD (major depressive disorder) Diagnosis:  Principal Problem:   MDD (major depressive disorder)   Clinical Decision Making:  Treatment Plan Summary:  Safety and  Monitoring:             -- Involuntary admission to inpatient psychiatric unit for safety, stabilization and treatment             -- Daily contact with patient to assess and evaluate symptoms and progress in treatment             -- Patient's case to be discussed in multi-disciplinary team meeting             -- Observation Level: q15 minute checks             -- Vital signs:  q12 hours             -- Precautions: suicide, elopement, and assault   2. Psychiatric Diagnoses and Treatment:  Prozac  20 mg daily Wellbutrin  150 mg ER Abilify  2 mg daily     -- The risks/benefits/side-effects/alternatives to this medication were discussed in detail with the patient and time was given for questions. The patient consents to medication trial.                -- Metabolic profile and EKG monitoring obtained while on an atypical antipsychotic (BMI: Lipid Panel: HbgA1c: QTc:)              -- Encouraged patient to participate in unit milieu and in scheduled group therapies  3. Medical Issues Being Addressed:  No issues expressed at this time   4. Discharge Planning:              -- Social work and case management to assist with discharge planning and identification of hospital follow-up needs prior to discharge             -- Estimated LOS: 5-7 days             -- Discharge Concerns: Need to establish a safety plan; Medication compliance and effectiveness             -- Discharge Goals: Return home with outpatient referrals follow ups  Physician Treatment Plan for Primary Diagnosis: MDD (major depressive disorder) Long Term Goal(s): Improvement in symptoms so as ready for discharge  Short Term Goals: Ability to identify changes in lifestyle to reduce recurrence of condition will improve, Ability to disclose and discuss suicidal ideas, Ability to identify and develop effective coping behaviors will improve, Ability to maintain clinical measurements within normal limits will  improve, Compliance with prescribed medications will improve, and Ability to identify triggers associated with substance abuse/mental health issues will improve   I certify that inpatient services furnished can reasonably be expected to improve the patient's condition.    Ilyse Tremain, NP 1/15/20268:55 PM      [1] No Known Allergies  "

## 2024-09-07 NOTE — BHH Suicide Risk Assessment (Signed)
 [facility] Admission Suicide Risk Assessment   Nursing information obtained from:  Patient Demographic factors:  Male, Caucasian Current Mental Status:  NA Loss Factors:  Legal issues Historical Factors:  Impulsivity Risk Reduction Factors:  Sense of responsibility to family  Total Time spent with patient: 1 hour Principal Problem: MDD (major depressive disorder) Diagnosis:  Principal Problem:   MDD (major depressive disorder)  Subjective Data:   The patient is a 64 year old male with a history of alcohol use disorder, GAD and MDD who was admitted to the inpatient psychiatric unit following Acute Suicidal Ideation and Severe Depression. The patient presented with worsening psychiatric symptoms in the setting of relapse on alcohol and legal concerns.   Patient reports he relapsed on alcohol on Tuesday after being triggered by his father needing to come to the hospital. He identifies his daughter-in-law was concerned so she brought him to the hospital. He endorses recent decrease in appetite and poor sleep.    Patient is dysphoric and sullen with minimal eye contact. He engages well, however remains guarded. He denies SI, HI, and AVH during assessment. He endorses having children who don't like me so he is not involved in their or his grandchildren's life. He reports his wife passed away years ago and then he moved back to [place] to live with his parents. He has several DUI and is not allowed to drink at all as per parole.    Regarding safety, the patient Denies SI. Homicidal ideation Denies HI. Due to safety concerns, the patient was admitted under Involuntary Admission.   The patient reports recent psychosocial stressors including relapse on alcohol, father being placed in hospital, and him potentially violating parole. Recent substance use includes Alcohol. There is not concern for withdrawal at this time as patient denies daily drinking and expressed 2 relapses over the last year. He reports  engaging in SAIOP three times weekly to maintain sobriety.   Continued Clinical Symptoms:  Alcohol Use Disorder Identification Test Final Score (AUDIT): 14 The Alcohol Use Disorders Identification Test, Guidelines for Use in Primary Care, Second Edition.  World Science Writer [facility]). Score between 0-7:  no or low risk or alcohol related problems. Score between 8-15:  moderate risk of alcohol related problems. Score between 16-19:  high risk of alcohol related problems. Score 20 or above:  warrants further diagnostic evaluation for alcohol dependence and treatment.   CLINICAL FACTORS:   Depression:   Anhedonia Comorbid alcohol abuse/dependence Hopelessness Insomnia Severe Alcohol/Substance Abuse/Dependencies Previous Psychiatric Diagnoses and Treatments   Musculoskeletal: Strength & Muscle Tone: within normal limits Gait & Station: normal Patient leans: N/A  Psychiatric Specialty Exam:  Presentation  General Appearance: Disheveled  Eye Contact:Minimal  Speech:Clear and Coherent  Speech Volume:Decreased  Handedness:No data recorded  Mood and Affect  Mood:Dysphoric; Depressed  Affect:Depressed   Thought Process  Thought Processes:Coherent  Descriptions of Associations:Intact  Orientation:Full (Time, Place and Person)  Thought Content:Logical  History of Schizophrenia/Schizoaffective disorder:No data recorded Duration of Psychotic Symptoms:No data recorded Hallucinations:Hallucinations: None  Ideas of Reference:None  Suicidal Thoughts:Suicidal Thoughts: No  Homicidal Thoughts:Homicidal Thoughts: No   Sensorium  Memory:Immediate Fair; Recent Fair  Judgment:Poor  Insight:Fair   Executive Functions  Concentration:Fair  Attention Span:Fair  Recall:Fair  Fund of Knowledge:Fair  Language:Fair   Psychomotor Activity  Psychomotor Activity:Psychomotor Activity: Normal   Assets  Assets:Housing; Vocational/Educational   Sleep   Sleep:Sleep: Poor    Physical Exam: Physical Exam Vitals and nursing note reviewed.  Constitutional:      Appearance:  Normal appearance.  Pulmonary:     Effort: Pulmonary effort is normal.  Neurological:     Mental Status: He is alert and oriented to person, place, and time.  Psychiatric:        Behavior: Behavior normal.    Review of Systems  Respiratory:  Negative for shortness of breath.   Cardiovascular:  Negative for chest pain.  Gastrointestinal:  Negative for diarrhea, nausea and vomiting.  Psychiatric/Behavioral:  Positive for depression and substance abuse. Negative for hallucinations and suicidal ideas. The patient is not nervous/anxious.   All other systems reviewed and are negative.  Blood pressure (!) 129/90, pulse 73, temperature (!) 97.4 F (36.3 C), temperature source Oral, resp. rate 16, SpO2 97%. There is no height or weight on file to calculate BMI.   COGNITIVE FEATURES THAT CONTRIBUTE TO RISK:  None    SUICIDE RISK:   Moderate:  Frequent suicidal ideation with limited intensity, and duration, some specificity in terms of plans, no associated intent, good self-control, limited dysphoria/symptomatology, some risk factors present, and identifiable protective factors, including available and accessible social support.  PLAN OF CARE: Inpatient psychiatric admission  I certify that inpatient services furnished can reasonably be expected to improve the patient's condition.   Stormy Sabol, NP 09/07/2024, 9:01 PM

## 2024-09-07 NOTE — BHH Group Notes (Signed)
 Spirituality Group   Description: Participant directed exploration of values, beliefs and meaning   **Focus on Gratitude: Invite reflection on sources of gratitude (external/internal); goal to invite internal gratitude to foster 1) reconnection with life-giving activities 2) self-compassion.   Following a brief framework of chaplains role and ground rules of group behavior, participants are invited to share concerns or questions that engage spiritual life. Emphasis placed on common themes and shared experiences and ways to make meaning and clarify living into ones values.   Theory/Process/Goal: Utilize the theoretical framework of group therapy established by Celena Kite, Relational Cultural Theory and Rogerian approaches to facilitate relational empathy and use of the here and now to foster reflection, self-awareness, and sharing.   Observations: Garrel was present. He was reserved and mostly passive in participation. Engaged when prompted.  Lanard Arguijo L. Delores HERO.Div

## 2024-09-07 NOTE — Group Note (Signed)
 Recreation Therapy Group Note   Group Topic:Health and Wellness  Group Date: 09/07/2024 Start Time: 1530 End Time: 1610 Facilitators: Celestia Jeoffrey BRAVO, LRT, CTRS Location: Dayroom  Group Description: Seated Exercise. LRT discussed the mental and physical benefits of exercise. LRT and group discussed how physical activity can be used as a coping skill. Pt's and LRT followed along to an exercise video on the TV screen that provided a visual representation and audio description of every exercise performed. Pt's encouraged to listen to their bodies and stop at any time if they experience feelings of discomfort or pain. Pts were encouraged to drink water and stay hydrated.   Goal Area(s) Addressed:  Patient will learn benefits of physical activity. Patient will identify exercise as a coping skill.  Patient will follow multistep directions. Patient will try a new leisure interest.    Affect/Mood: Flat   Participation Level: Non-verbal    Clinical Observations/Individualized Feedback: Nicholas Durham came late to group. Pt did not complete any of the exercises as prompted.   Plan: Continue to engage patient in RT group sessions 2-3x/week.   Jeoffrey BRAVO Celestia, LRT, CTRS 09/07/2024 5:06 PM

## 2024-09-07 NOTE — Progress Notes (Signed)
" °   09/07/24 0802  Psych Admission Type (Psych Patients Only)  Admission Status Involuntary  Psychosocial Assessment  Patient Complaints Anxiety  Eye Contact Fair  Facial Expression Flat  Affect Appropriate to circumstance  Speech Logical/coherent  Interaction No initiation  Motor Activity Slow  Appearance/Hygiene In scrubs  Behavior Characteristics Cooperative;Calm  Mood Pleasant  Thought Process  Coherency WDL  Content WDL  Delusions None reported or observed  Perception WDL  Hallucination None reported or observed  Judgment WDL  Confusion None  Danger to Self  Current suicidal ideation? Denies  Agreement Not to Harm Self Yes  Description of Agreement verbal  Danger to Others  Danger to Others None reported or observed   D- Patient alert and oriented. Pt presents with a flat and sad mood and affect. Pt denies SI, HI, AVH, but endorsing pain 8/10- knee and hip. Pt states his goal is to get his medication right. Pt endorsing anxiety 8/10 and depression 10/10.   A- Scheduled medications administered to patient, per MD orders. Support and encouragement provided.  Routine safety checks conducted every 15 minutes.  Patient informed to notify staff with problems or concerns.  R- No adverse drug reactions noted. Patient contracts for safety at this time. Patient compliant with medications and treatment plan. Patient receptive, calm, and cooperative. Patient  did interacted  well with others on the unit.  Patient remains safe at this time.    Shaneese Tait S.,RN "

## 2024-09-07 NOTE — Progress Notes (Signed)
" °   09/07/24 2100  Psych Admission Type (Psych Patients Only)  Admission Status Involuntary  Psychosocial Assessment  Patient Complaints Anxiety  Eye Contact Fair  Facial Expression Flat  Affect Appropriate to circumstance  Speech Logical/coherent  Interaction No initiation  Motor Activity Slow  Appearance/Hygiene In scrubs  Behavior Characteristics Cooperative  Mood Pleasant  Aggressive Behavior  Effect No apparent injury  Thought Process  Coherency WDL  Content WDL  Delusions None reported or observed  Perception WDL  Hallucination None reported or observed  Judgment Limited  Confusion None  Danger to Self  Current suicidal ideation? Denies  Agreement Not to Harm Self Yes  Description of Agreement Verbal  Danger to Others  Danger to Others None reported or observed    "

## 2024-09-07 NOTE — Group Note (Signed)
 Date:  09/07/2024 Time:  8:53 PM  Group Topic/Focus:  Emotional Education:   The focus of this group is to discuss what feelings/emotions are, and how they are experienced.    Participation Level:  Active  Participation Quality:  Appropriate  Affect:  Appropriate  Cognitive:  Appropriate  Insight: Appropriate  Engagement in Group:  Engaged  Modes of Intervention:  Discussion and Education  Additional Comments:    Adiya Selmer L 09/07/2024, 8:53 PM

## 2024-09-07 NOTE — Plan of Care (Signed)
" °  Problem: Education: Goal: Emotional status will improve Outcome: Not Progressing Goal: Verbalization of understanding the information provided will improve Outcome: Not Progressing   Problem: Activity: Goal: Interest or engagement in activities will improve Outcome: Not Progressing Goal: Sleeping patterns will improve Outcome: Not Progressing   "

## 2024-09-07 NOTE — Progress Notes (Signed)
" °   09/07/24 0100  Psych Admission Type (Psych Patients Only)  Admission Status Involuntary  Psychosocial Assessment  Patient Complaints Anxiety  Eye Contact Fair  Facial Expression Flat  Affect Anxious  Speech Logical/coherent  Interaction No initiation  Motor Activity Slow  Appearance/Hygiene In scrubs  Behavior Characteristics Calm  Mood Sad  Aggressive Behavior  Effect No apparent injury  Thought Process  Coherency Circumstantial  Content WDL  Delusions None reported or observed  Perception WDL  Hallucination None reported or observed  Judgment WDL  Confusion WDL  Danger to Self  Current suicidal ideation? Denies  Danger to Others  Danger to Others None reported or observed    "

## 2024-09-07 NOTE — Plan of Care (Signed)
  Problem: Education: Goal: Knowledge of [facility] Education information/materials will improve Outcome: Progressing Goal: Emotional status will improve Outcome: Progressing Goal: Mental status will improve Outcome: Progressing Goal: Verbalization of understanding the information provided will improve Outcome: Progressing   Problem: Activity: Goal: Interest or engagement in activities will improve Outcome: Progressing Goal: Sleeping patterns will improve Outcome: Progressing   Problem: Coping: Goal: Ability to verbalize frustrations and anger appropriately will improve Outcome: Progressing Goal: Ability to demonstrate self-control will improve Outcome: Progressing   Problem: Health Behavior/Discharge Planning: Goal: Identification of resources available to assist in meeting health care needs will improve Outcome: Progressing Goal: Compliance with treatment plan for underlying cause of condition will improve Outcome: Progressing   Problem: Physical Regulation: Goal: Ability to maintain clinical measurements within normal limits will improve Outcome: Progressing   Problem: Safety: Goal: Periods of time without injury will increase Outcome: Progressing   Problem: Education: Goal: Utilization of techniques to improve thought processes will improve Outcome: Progressing Goal: Knowledge of the prescribed therapeutic regimen will improve Outcome: Progressing   Problem: Activity: Goal: Interest or engagement in leisure activities will improve Outcome: Progressing Goal: Imbalance in normal sleep/wake cycle will improve Outcome: Progressing   Problem: Coping: Goal: Coping ability will improve Outcome: Progressing Goal: Will verbalize feelings Outcome: Progressing   Problem: Health Behavior/Discharge Planning: Goal: Ability to make decisions will improve Outcome: Progressing Goal: Compliance with therapeutic regimen will improve Outcome: Progressing    Problem: Role Relationship: Goal: Will demonstrate positive changes in social behaviors and relationships Outcome: Progressing   Problem: Safety: Goal: Ability to disclose and discuss suicidal ideas will improve Outcome: Progressing Goal: Ability to identify and utilize support systems that promote safety will improve Outcome: Progressing   Problem: Self-Concept: Goal: Will verbalize positive feelings about self Outcome: Progressing Goal: Level of anxiety will decrease Outcome: Progressing   Problem: Education: Goal: Knowledge of disease or condition will improve Outcome: Progressing Goal: Understanding of discharge needs will improve Outcome: Progressing   Problem: Health Behavior/Discharge Planning: Goal: Ability to identify changes in lifestyle to reduce recurrence of condition will improve Outcome: Progressing Goal: Identification of resources available to assist in meeting health care needs will improve Outcome: Progressing   Problem: Physical Regulation: Goal: Complications related to the disease process, condition or treatment will be avoided or minimized Outcome: Progressing   Problem: Safety: Goal: Ability to remain free from injury will improve Outcome: Progressing   

## 2024-09-07 NOTE — Group Note (Signed)
 Recreation Therapy Group Note   Group Topic:Goal Setting  Group Date: 09/07/2024 Start Time: 1000 End Time: 1110 Facilitators: Celestia Jeoffrey BRAVO, LRT, CTRS Location: Craft Room  Group Description: Vision Boards. Patients were given many different magazines, a glue stick, markers, and a piece of cardstock paper. LRT and pts discussed the importance of having goals in life. LRT and pts discussed the difference between short-term and long-term goals, as well as what a SMART goal is. LRT encouraged pts to create a vision board, with images they picked and then cut out with safety scissors from the magazine, for themselves, that capture their short and long-term goals. LRT encouraged pts to show and explain their vision board to the group.   Goal Area(s) Addressed:  Patient will gain knowledge of short vs. long term goals.  Patient will identify goals for themselves. Patient will practice setting SMART goals. Patient will verbalize their goals to LRT and peers.   Affect/Mood: Appropriate and Flat   Participation Level: Moderate   Participation Quality: Independent   Behavior: Calm and Cooperative   Speech/Thought Process: Coherent   Insight: Fair   Judgement: Fair    Modes of Intervention: Art, Education, and Exploration   Patient Response to Interventions:  Receptive   Education Outcome:  In group clarification offered    Clinical Observations/Individualized Feedback: Nicholas Durham was mostly active in their participation of session activities and group discussion. Pt identified to go home to be with my dog and to stay home with my dog as goals.    Plan: Continue to engage patient in RT group sessions 2-3x/week.   Jeoffrey BRAVO Celestia, LRT, CTRS 09/07/2024 11:26 AM

## 2024-09-07 NOTE — BHH Counselor (Signed)
 Adult Comprehensive Assessment  Patient ID: Nicholas Durham, adult   DOB: October 09, 1960, 64 y.o.   MRN: [id number]  Information Source: Information source: Patient  Current Stressors:  Patient states their primary concerns and needs for treatment are:: Police brought me here because my daughter-in-law got mad at me for driving car. Said she smelt alcohol on me. Pt acknowledged that he had drank a beer but stated that he was not drunk. Patient states their goals for this hospitilization and ongoing recovery are:: Whatever I need to do to get out. Educational / Learning stressors: None reported Employment / Job issues: None reported Family Relationships: He shared that his oldest son and his wife do not care for him and are mean to him. Financial / Lack of resources (include bankruptcy): He reported receiving his wife's social security and can no longer receive EBT. Housing / Lack of housing: None reported Physical health (include injuries & life threatening diseases): Right knee displaced, neuropathy, right hip pain. Social relationships: None reported Substance abuse: Pt reported some alcohol use. Bereavement / Loss: Wife died in 09/14/2008.  Living/Environment/Situation:  Living Arrangements: Parent Living conditions (as described by patient or guardian): In my parents' home in their basement. Who else lives in the home?: Pt lives with mom and dad. He shared that his grandson has also been staying there. How long has patient lived in current situation?: For about 14-15 years. What is atmosphere in current home: Other (Comment) (It's fine as long as my family doesn't do what they did to me Tuesday.)  Family History:  Marital status: Widowed Widowed, when?: Jan 15, 2009. Are you sexually active?: No What is your sexual orientation?: Straight. Does patient have children?: Yes How many children?: 2 (adult sons aged 28 and 41) How is patient's relationship with their children?: They  don't like me very much.  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 He shared that both of his parents also worked full time. Description of patient's relationship with caregiver when they were a child: It was fine. They were tough but not cruel. Patient's description of current relationship with people who raised him/her: I think it's fine. How were you disciplined when you got in trouble as a child/adolescent?: Got spanking when I did something wrong. Does patient have siblings?: Yes Number of Siblings: 1 (Older brother) Description of patient's current relationship with siblings: It's just flat, not mean or hateful. Did patient suffer any verbal/emotional/physical/sexual abuse as a child?: No Did patient suffer from severe childhood neglect?: No Has patient ever been sexually abused/assaulted/raped as an adolescent or adult?: No Was the patient ever a victim of a crime or a disaster?: No Witnessed domestic violence?: No Has patient been affected by domestic violence as an adult?: No  Education:  Highest grade of school patient has completed: Engineer, agricultural, Associates degree in statistician Currently a student?: No Learning disability?: No  Employment/Work Situation:   Employment Situation: Unemployed Patient's Job has Been Impacted by Current Illness: No What is the Longest Time Patient has Held a Job?: From 2014-2022. 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 Has Patient ever Been in the [employer]?: No  Financial Resources:   Financial resources: Medicaid Does patient have a representative payee or guardian?: No  Alcohol/Substance Abuse:   What has been your use of drugs/alcohol within  the last 12 months?: Pt stated that he had a beer on  Tuesday of this week. I'm not supposed to drink or drive. He is not forthcoming with his use. If attempted suicide, did drugs/alcohol play a role in this?: No Alcohol/Substance Abuse Treatment Hx: Attends AA/NA, Past Tx, Outpatient If yes, describe treatment and response: Pt reported doing SAIOP through [facility] in [place]. Is patient motivated for change?: Yes Does patient live in an environment that promotes recovery or serves as an obstacle to recovery?: No Are others in the home using alcohol or other substances?: No Are significant others in the home willing to participate in the patient's care?: Yes Has alcohol/substance abuse ever caused legal problems?: Yes (He reported three DUI in 10/02/2022. On parole for DUI.)  Social Support System:   Patient's Community Support System: Poor Describe Community Support System: [facility] people, and anybody I might happen to go to a meeting with. Type of faith/religion: Not as much as I should be but yea, I'm baptist. How does patient's faith help to cope with current illness?: Not much because I don't go to church anymore.  Leisure/Recreation:   Do You Have Hobbies?: No (Not lately, no. I have done wood working, and painting and drawing but nothing in the past two months.)  Strengths/Needs:   What is the patient's perception of their strengths?: I'm basically an honest person, respectful, if there's work to be done I do it. Patient states they can use these personal strengths during their treatment to contribute to their recovery: Unable to assess Patient states these barriers may affect/interfere with their treatment: Pt denied any barriers. Patient states these barriers may affect their return to the community: Pt denied any barriers  Discharge Plan:   Currently receiving community mental health services: Yes (From Whom) ([facility] in [place]) Patient states concerns and preferences for aftercare planning are: Pt plans to continue with [facility]  for SAIOP. Patient states they will know when they are safe and ready for discharge when: I'm safe and ready for discharge now. Does patient have access to transportation?: No Does patient have financial barriers related to discharge medications?: No Plan for no access to transportation at discharge: CSW will assist with transportation arrangements at discharge. Will patient be returning to same living situation after discharge?: Yes  Summary/Recommendations:   Summary and Recommendations (to be completed by the evaluator): Patient is a 64 year old, widowed, male [facility] [place] [place] [facility] He reported that he came into the hospital by police because his daughter-in-law got mad at him for driving the car. Pt admitted that he is not supposed to be driving or drinking as he is on parole, and he had a beer prior to driving the car which his daughter-in-law smelt on him. He shared that he is on parole after being incarcerated for three DUIs in 10/02/22. Pt lives with his parents, in their basement, stating that he provides care for them. He endorsed plans to return there upon discharge. Stressors were reported as being on a fixed income, right knee displacement, right hip pain, neuropathy, and his wifes death in Oct 02, 2008. Pt denied any history of abuse or trauma. He endorsed recent alcohol use. Pt reported last drink was on Tuesday and that he drank two tall boys. He reported that he is receiving SAIOP through [facility] in [place]. During discussion about contacting them, pt asked that it not be shared about why he is here. Pt plans to continue with them after discharge. Recommendations include:  crisis stabilization, therapeutic milieu, encourage group attendance and participation, medication management for mood stabilization and development of a comprehensive mental wellness/sobriety plan.  Nadara JONELLE Fam. 09/07/2024

## 2024-09-07 NOTE — Group Note (Signed)
 [facility] LCSW Group Therapy Note   Group Date: 09/07/2024 Start Time: 1300 End Time: 1400   Type of Therapy/Topic:  Group Therapy:  Balance in Life  Participation Level:  None   Description of Group:    This group will address the concept of balance and how it feels and looks when one is unbalanced. Patients will be encouraged to process areas in their lives that are out of balance, and identify reasons for remaining unbalanced. Facilitators will guide patients utilizing problem- solving interventions to address and correct the stressor making their life unbalanced. Understanding and applying boundaries will be explored and addressed for obtaining  and maintaining a balanced life. Patients will be encouraged to explore ways to assertively make their unbalanced needs known to significant others in their lives, using other group members and facilitator for support and feedback.  Therapeutic Goals: Patient will identify two or more emotions or situations they have that consume much of in their lives. Patient will identify signs/triggers that life has become out of balance:  Patient will identify two ways to set boundaries in order to achieve balance in their lives:  Patient will demonstrate ability to communicate their needs through discussion and/or role plays  Summary of Patient Progress: Patient was present for part of the group. During his time in the group, pt did not participate in the discussion.   Therapeutic Modalities:   Cognitive Behavioral Therapy Solution-Focused Therapy Assertiveness Training   Nadara JONELLE Fam, LCSW

## 2024-09-08 LAB — LIPID PANEL
Cholesterol: 138 mg/dL (ref 0–200)
HDL: 32 mg/dL — ABNORMAL LOW
LDL Cholesterol: 89 mg/dL (ref 0–99)
Total CHOL/HDL Ratio: 4.4 ratio
Triglycerides: 85 mg/dL
VLDL: 17 mg/dL (ref 0–40)

## 2024-09-08 LAB — TSH: TSH: 0.603 u[IU]/mL (ref 0.350–4.500)

## 2024-09-08 LAB — HEMOGLOBIN A1C
Hgb A1c MFr Bld: 5.3 % (ref 4.8–5.6)
Mean Plasma Glucose: 105.41 mg/dL

## 2024-09-08 MED ORDER — AMLODIPINE BESYLATE 5 MG PO TABS
5.0000 mg | ORAL_TABLET | Freq: Every day | ORAL | Status: DC
  Administered 2024-09-08 – 2024-09-11 (×4): 5 mg via ORAL
  Filled 2024-09-08 (×4): qty 1

## 2024-09-08 NOTE — Group Note (Signed)
 Recreation Therapy Group Note   Group Topic:Stress Management  Group Date: 09/08/2024 Start Time: 1530 End Time: 1610 Facilitators: Celestia Jeoffrey BRAVO, LRT, CTRS Location: Dayroom  Group Description: Taboo. LRT and patients played the game Taboo. The object of the game is to have peers guess the word up at the top of the card drawn that is in bold, while being sure to not use any of the descriptive words down below it on the same card. If the person attempting to explain the word in bold uses any of the descriptive words down below, they lose their turn, and no one receives that card or point. LRT and patient's took turns being the one to describe the words while the rest of the group tried to guess what they were describing.   Goal Area(s) Addressed: Patient will identify physical symptoms of stress. Patient will identify emotional symptoms of stress. Patient will identify coping skills for stress. Patient will build frustration tolerance skills.  Patient will increase communication.   Affect/Mood: N/A   Participation Level: Did not attend    Clinical Observations/Individualized Feedback: Patient did not attend.  Plan: Continue to engage patient in RT group sessions 2-3x/week.   Jeoffrey BRAVO Celestia, LRT, CTRS 09/08/2024 4:47 PM

## 2024-09-08 NOTE — Group Note (Signed)
 Date:  09/08/2024 Time:  11:55 AM  Group Topic/Focus:  Goals Group:   The focus of this group is to help patients establish daily goals to achieve during treatment and discuss how the patient can incorporate goal setting into their daily lives to aide in recovery.    Participation Level:  active   Participation Quality:  Appropriate  Affect:  Appropriate  Cognitive:  Appropriate  Insight: Appropriate  Engagement in Group:  Engaged  Modes of Intervention:  Activity  Additional Comments:    Nicholas Durham June 09/08/2024, 11:55 AM

## 2024-09-08 NOTE — Progress Notes (Signed)
" °   09/08/24 1500  Psych Admission Type (Psych Patients Only)  Admission Status Involuntary  Psychosocial Assessment  Patient Complaints Anxiety  Eye Contact Fair  Facial Expression Flat  Affect Appropriate to circumstance  Speech Logical/coherent  Interaction No initiation  Motor Activity Slow  Appearance/Hygiene In scrubs  Behavior Characteristics Cooperative  Mood Sad  Thought Process  Coherency WDL  Content WDL  Delusions None reported or observed  Perception WDL  Hallucination None reported or observed  Judgment Limited  Confusion None  Danger to Self  Current suicidal ideation? Denies  Agreement Not to Harm Self Yes  Description of Agreement verbal  Danger to Others  Danger to Others None reported or observed    "

## 2024-09-08 NOTE — BH IP Treatment Plan (Signed)
 Interdisciplinary Treatment and Diagnostic Plan Update  09/08/2024 Time of Session: 10:13 AM Nicholas Durham MRN: [id number]  Principal Diagnosis: MDD (major depressive disorder)  Secondary Diagnoses: Principal Problem:   MDD (major depressive disorder)   Current Medications:  Current Facility-Administered Medications  Medication Dose Route Frequency Provider Last Rate Last Admin   acetaminophen  (TYLENOL ) tablet 650 mg  650 mg Oral Q6H PRN Smith, Annie B, NP   650 mg at 09/08/24 0853   alum & mag hydroxide-simeth (MAALOX/MYLANTA) 200-200-20 MG/5ML suspension 30 mL  30 mL Oral Q4H PRN Smith, Annie B, NP       ARIPiprazole  (ABILIFY ) tablet 2 mg  2 mg Oral Daily May, Tanya, NP   2 mg at 09/08/24 9149   buPROPion  (WELLBUTRIN  XL) 24 hr tablet 150 mg  150 mg Oral Daily May, Tanya, NP   150 mg at 09/08/24 9149   FLUoxetine  (PROZAC ) capsule 20 mg  20 mg Oral Daily Smith, Annie B, NP   20 mg at 09/07/24 0802   folic acid  (FOLVITE ) tablet 1 mg  1 mg Oral Daily Smith, Annie B, NP   1 mg at 09/08/24 0850   LORazepam  (ATIVAN ) tablet 1-4 mg  1-4 mg Oral Q1H PRN Smith, Annie B, NP       Or   LORazepam  (ATIVAN ) injection 1-4 mg  1-4 mg Intravenous Q1H PRN Smith, Annie B, NP       magnesium  hydroxide (MILK OF MAGNESIA) suspension 30 mL  30 mL Oral Daily PRN Smith, Annie B, NP       multivitamin with minerals tablet 1 tablet  1 tablet Oral Daily Smith, Annie B, NP   1 tablet at 09/08/24 9149   OLANZapine  (ZYPREXA ) injection 5 mg  5 mg Intramuscular TID PRN Smith, Annie B, NP       OLANZapine  (ZYPREXA ) tablet 5 mg  5 mg Oral TID PRN Smith, Annie B, NP       thiamine  (VITAMIN B1) tablet 100 mg  100 mg Oral Daily Smith, Annie B, NP   100 mg at 09/08/24 9149   Or   thiamine  (VITAMIN B1) injection 100 mg  100 mg Intravenous Daily Smith, Annie B, NP       PTA Medications: Medications Prior to Admission  Medication Sig Dispense Refill Last Dose/Taking   folic acid  (FOLVITE ) 1 MG tablet Take 1 tablet (1 mg  total) by mouth daily. 30 tablet 0    hydrOXYzine  (ATARAX ) 25 MG tablet Take 25 mg by mouth 3 (three) times daily.      Ibuprofen-diphenhydrAMINE HCl 200-25 MG CAPS Take 1-2 capsules by mouth at bedtime as needed (sleep).      Multiple Vitamin (MULTIVITAMIN WITH MINERALS) TABS tablet Take 1 tablet by mouth daily. 90 tablet 0    nicotine  (NICODERM CQ  - DOSED IN MG/24 HOURS) 21 mg/24hr patch Place 1 patch (21 mg total) onto the skin daily. 28 patch 0    thiamine  100 MG tablet Take 1 tablet (100 mg total) by mouth daily. 30 tablet 0     Patient Stressors:    Patient Strengths:    Treatment Modalities: Medication Management, Group therapy, Case management,  1 to 1 session with clinician, Psychoeducation, Recreational therapy.   Physician Treatment Plan for Primary Diagnosis: MDD (major depressive disorder) Long Term Goal(s): Improvement in symptoms so as ready for discharge   Short Term Goals: Ability to identify changes in lifestyle to reduce recurrence of condition will improve Ability to disclose and discuss suicidal ideas  Ability to identify and develop effective coping behaviors will improve Ability to maintain clinical measurements within normal limits will improve Compliance with prescribed medications will improve Ability to identify triggers associated with substance abuse/mental health issues will improve  Medication Management: Evaluate patient's response, side effects, and tolerance of medication regimen.  Therapeutic Interventions: 1 to 1 sessions, Unit Group sessions and Medication administration.  Evaluation of Outcomes: Not Met  Physician Treatment Plan for Secondary Diagnosis: Principal Problem:   MDD (major depressive disorder)  Long Term Goal(s): Improvement in symptoms so as ready for discharge   Short Term Goals: Ability to identify changes in lifestyle to reduce recurrence of condition will improve Ability to disclose and discuss suicidal ideas Ability to  identify and develop effective coping behaviors will improve Ability to maintain clinical measurements within normal limits will improve Compliance with prescribed medications will improve Ability to identify triggers associated with substance abuse/mental health issues will improve     Medication Management: Evaluate patient's response, side effects, and tolerance of medication regimen.  Therapeutic Interventions: 1 to 1 sessions, Unit Group sessions and Medication administration.  Evaluation of Outcomes: Not Met   RN Treatment Plan for Primary Diagnosis: MDD (major depressive disorder) Long Term Goal(s): Knowledge of disease and therapeutic regimen to maintain health will improve  Short Term Goals: Ability to verbalize frustration and anger appropriately will improve, Ability to demonstrate self-control, Ability to participate in decision making will improve, Ability to verbalize feelings will improve, Ability to disclose and discuss suicidal ideas, and Ability to identify and develop effective coping behaviors will improve  Medication Management: RN will administer medications as ordered by provider, will assess and evaluate patient's response and provide education to patient for prescribed medication. RN will report any adverse and/or side effects to prescribing provider.  Therapeutic Interventions: 1 on 1 counseling sessions, Psychoeducation, Medication administration, Evaluate responses to treatment, Monitor vital signs and CBGs as ordered, Perform/monitor CIWA, COWS, AIMS and Fall Risk screenings as ordered, Perform wound care treatments as ordered.  Evaluation of Outcomes: Not Met   LCSW Treatment Plan for Primary Diagnosis: MDD (major depressive disorder) Long Term Goal(s): Safe transition to appropriate next level of care at discharge, Engage patient in therapeutic group addressing interpersonal concerns.  Short Term Goals: Engage patient in aftercare planning with referrals and  resources, Increase social support, Increase ability to appropriately verbalize feelings, Increase emotional regulation, Facilitate acceptance of mental health diagnosis and concerns, Facilitate patient progression through stages of change regarding substance use diagnoses and concerns, Identify triggers associated with mental health/substance abuse issues, and Increase skills for wellness and recovery  Therapeutic Interventions: Assess for all discharge needs, 1 to 1 time with Social worker, Explore available resources and support systems, Assess for adequacy in community support network, Educate family and significant other(s) on suicide prevention, Complete Psychosocial Assessment, Interpersonal group therapy.  Evaluation of Outcomes: Not Met   Progress in Treatment: Attending groups: Yes. Participating in groups: Yes. Taking medication as prescribed: Yes. Toleration medication: Yes. Family/Significant other contact made: No, will contact:  Hardie Veltre, dad, [phone] Patient understands diagnosis: Yes. Discussing patient identified problems/goals with staff: Yes. Medical problems stabilized or resolved: Yes. Denies suicidal/homicidal ideation: Yes. Issues/concerns per patient self-inventory: No. Other: None  New problem(s) identified: No, Describe:  None  New Short Term/Long Term Goal(s): detox, elimination of symptoms of psychosis, medication management for mood stabilization; elimination of SI thoughts; development of comprehensive mental wellness/sobriety plan.    Patient Goals:  Whenever you all can let  me go is my goal.  Discharge Plan or Barriers: CSW to assist with the development of appropriate discharge plan.    Reason for Continuation of Hospitalization: Aggression Anxiety Depression Suicidal ideation Withdrawal symptoms  Estimated Length of Stay: 1-7 days.   Last 3 [place] Suicide Severity Risk Score: Flowsheet Row Admission (Current) from [facility]/06/20[facility]  INPATIENT BEHAVIORAL MEDICINE ED from 09/05/2024 in [facility] Emergency Department at [facility]  C-SSRS RISK CATEGORY Error: Q3, 4, or 5 should not be populated when Q2 is No Moderate Risk    Last PHQ 2/9 Scores:     No data to display          Scribe for Treatment Team: Bretta Fees M Hilde Churchman, LCSW 09/08/2024 10:41 AM

## 2024-09-08 NOTE — Progress Notes (Signed)
 [facility] MD Progress Note  09/08/2024 3:17 PM Nicholas Durham  MRN:  [id number]   Subjective:  Chart reviewed, case discussed in multidisciplinary meeting, patient seen during rounds.   Patient seen on rounds today on inpatient psychiatric unit. Denies current suicidal ideations, homicidal ideations, auditory hallucinations, and visual hallucinations. Reports doing overall okay. Recently initiated on Abilify  to augment existing regimen of Prozac  and Wellbutrin . Patient reports tolerating medication well without noticeable side effects at this time. Objectively, no evidence of medication-related side effects noted on examination including absence of extrapyramidal symptoms, akathisia, or other movement disorders. Patient reports he was previously prescribed amlodipine  for blood pressure control but has been without this medication for some time. Review of flowsheets reveals elevated blood pressure readings while on unit including 129/90, 148/86, and 131/89 among others, indicating inadequate blood pressure control. This provider contacted [facility] which confirmed patient was previously prescribed amlodipine  10 mg daily with last fill date of September 2025 for 90-day supply. Given elevated blood pressure readings and previous prescription history, amlodipine  will be reinitiated at 5 mg daily with plan to titrate further as needed based on blood pressure trends and clinical response. Patient denies associated symptoms including chest pain, shortness of breath, headaches, dizziness, or syncopal episodes. Patient continues to demonstrate depressed affect but remains cooperative with psychiatry assessment and treatment plan.    Past Psychiatric History: see h&P Family History: History reviewed. No pertinent family history. Social History:  Social History   Substance and Sexual Activity  Alcohol Use Yes   Comment: Stopped drinking 01/12/19     Social History   Substance and Sexual  Activity  Drug Use Yes   Types: Marijuana    Social History   Socioeconomic History   Marital status: Unknown    Spouse name: Not on file   Number of children: Not on file   Years of education: Not on file   Highest education level: Not on file  Occupational History   Not on file  Tobacco Use   Smoking status: Every Day    Current packs/day: 0.50    Types: Cigarettes   Smokeless tobacco: Never  Substance and Sexual Activity   Alcohol use: Yes    Comment: Stopped drinking 01/12/19   Drug use: Yes    Types: Marijuana   Sexual activity: Not on file  Other Topics Concern   Not on file  Social History Narrative   Not on file   Social Drivers of Health   Tobacco Use: High Risk (09/06/2024)   Patient History    Smoking Tobacco Use: Every Day    Smokeless Tobacco Use: Never    Passive Exposure: Not on file  Financial Resource Strain: High Risk (08/12/2022)   Received from [facility]   Overall Financial Resource Strain (CARDIA)    Difficulty of Paying Living Expenses: Very hard  Food Insecurity: No Food Insecurity (09/06/2024)   Epic    Worried About Radiation Protection Practitioner of Food in the Last Year: Never true    Ran Out of Food in the Last Year: Never true  Transportation Needs: No Transportation Needs (09/06/2024)   Epic    Lack of Transportation (Medical): No    Lack of Transportation (Non-Medical): No  Physical Activity: Not on file  Stress: Not on file  Social Connections: Not on file  Depression (EYV7-0): Not on file  Alcohol Screen: Medium Risk (09/06/2024)   Alcohol Screen    Last Alcohol Screening Score (AUDIT): 14  Housing: Low  Risk (09/06/2024)   Epic    Unable to Pay for Housing in the Last Year: No    Number of Times Moved in the Last Year: 0    Homeless in the Last Year: No  Utilities: Not At Risk (09/06/2024)   Epic    Threatened with loss of utilities: No  Health Literacy: Not on file   Past Medical History:  Past Medical History:  Diagnosis Date   ETOH  abuse     Past Surgical History:  Procedure Laterality Date   APPENDECTOMY      Current Medications: Current Facility-Administered Medications  Medication Dose Route Frequency Provider Last Rate Last Admin   acetaminophen  (TYLENOL ) tablet 650 mg  650 mg Oral Q6H PRN Anael Rosch B, NP   650 mg at 09/08/24 1502   alum & mag hydroxide-simeth (MAALOX/MYLANTA) 200-200-20 MG/5ML suspension 30 mL  30 mL Oral Q4H PRN Lucrecia Mcphearson B, NP       amLODipine  (NORVASC ) tablet 5 mg  5 mg Oral Daily Virgilia Quigg B, NP   5 mg at 09/08/24 1501   ARIPiprazole  (ABILIFY ) tablet 2 mg  2 mg Oral Daily May, Tanya, NP   2 mg at 09/08/24 9149   buPROPion  (WELLBUTRIN  XL) 24 hr tablet 150 mg  150 mg Oral Daily May, Tanya, NP   150 mg at 09/08/24 0850   FLUoxetine  (PROZAC ) capsule 20 mg  20 mg Oral Daily Jeannine Pennisi B, NP   20 mg at 09/08/24 9163   folic acid  (FOLVITE ) tablet 1 mg  1 mg Oral Daily Kaitlyn Franko B, NP   1 mg at 09/08/24 0850   LORazepam  (ATIVAN ) tablet 1-4 mg  1-4 mg Oral Q1H PRN Chaka Jefferys B, NP       Or   LORazepam  (ATIVAN ) injection 1-4 mg  1-4 mg Intravenous Q1H PRN Jabori Henegar B, NP       magnesium  hydroxide (MILK OF MAGNESIA) suspension 30 mL  30 mL Oral Daily PRN Lafern Brinkley B, NP       multivitamin with minerals tablet 1 tablet  1 tablet Oral Daily Claudene Sham B, NP   1 tablet at 09/08/24 0850   OLANZapine  (ZYPREXA ) injection 5 mg  5 mg Intramuscular TID PRN Ellan Tess B, NP       OLANZapine  (ZYPREXA ) tablet 5 mg  5 mg Oral TID PRN Tabria Steines B, NP       thiamine  (VITAMIN B1) tablet 100 mg  100 mg Oral Daily Shirle Provencal B, NP   100 mg at 09/08/24 9149   Or   thiamine  (VITAMIN B1) injection 100 mg  100 mg Intravenous Daily Arlin Sass B, NP        Lab Results:  Results for orders placed or performed during the hospital encounter of 09/06/24 (from the past 48 hours)  Hemoglobin A1c     Status: None   Collection Time: 09/08/24  9:43 AM  Result Value Ref Range   Hgb A1c MFr Bld  5.3 4.8 - 5.6 %    Comment: (NOTE) Diagnosis of Diabetes The following HbA1c ranges recommended by the American Diabetes Association (ADA) may be used as an aid in the diagnosis of diabetes mellitus.  Hemoglobin             Suggested A1C NGSP%              Diagnosis  <5.7  Non Diabetic  5.7-6.4                Pre-Diabetic  >6.4                   Diabetic  <7.0                   Glycemic control for                       adults with diabetes.     Mean Plasma Glucose 105.41 mg/dL    Comment: Performed at [facility], [address], [place], [place] [address]  Lipid panel     Status: Abnormal   Collection Time: 09/08/24  9:43 AM  Result Value Ref Range   Cholesterol 138 0 - 200 mg/dL    Comment:        ATP III CLASSIFICATION:  <200     mg/dL   Desirable  799-760  mg/dL   Borderline High  >=759    mg/dL   High           Triglycerides 85 <150 mg/dL   HDL 32 (L) >59 mg/dL   Total CHOL/HDL Ratio 4.4 RATIO   VLDL 17 0 - 40 mg/dL   LDL Cholesterol 89 0 - 99 mg/dL    Comment:        Total Cholesterol/HDL:CHD Risk Coronary Heart Disease Risk Table                     Men   Women  1/2 Average Risk   3.4   3.3  Average Risk       5.0   4.4  2 X Average Risk   9.6   7.1  3 X Average Risk  23.4   11.0        Use the calculated Patient Ratio above and the CHD Risk Table to determine the patient's CHD Risk.        ATP III CLASSIFICATION (LDL):  <100     mg/dL   Optimal  899-870  mg/dL   Near or Above                    Optimal  130-159  mg/dL   Borderline  839-810  mg/dL   High  >809     mg/dL   Very High Performed at [facility], [address] [place], [place] [address]   TSH     Status: None   Collection Time: 09/08/24  9:43 AM  Result Value Ref Range   TSH 0.603 0.350 - 4.500 uIU/mL    Comment: Performed at [facility], [address] [place], [place] [address]    Blood Alcohol level:  Lab Results  Component  Value Date   ETH 147 (H) 09/05/2024    Metabolic Disorder Labs: Lab Results  Component Value Date   HGBA1C 5.3 09/08/2024   MPG 105.41 09/08/2024   No results found for: PROLACTIN Lab Results  Component Value Date   CHOL 138 09/08/2024   TRIG 85 09/08/2024   HDL 32 (L) 09/08/2024   CHOLHDL 4.4 09/08/2024   VLDL 17 09/08/2024   LDLCALC 89 09/08/2024    Physical Findings: AIMS:  , ,  ,  ,    CIWA:  CIWA-Ar Total: 1 COWS:      Psychiatric Specialty Exam:  Presentation  General Appearance:  Disheveled  Eye  Contact: Minimal  Speech: Clear and Coherent  Speech Volume: Decreased    Mood and Affect  Mood: Dysphoric; Depressed  Affect: Depressed   Thought Process  Thought Processes: Coherent  Orientation:Full (Time, Place and Person)  Thought Content:Logical  Hallucinations:Hallucinations: None  Ideas of Reference:None  Suicidal Thoughts:Suicidal Thoughts: No  Homicidal Thoughts:Homicidal Thoughts: No   Sensorium  Memory: Immediate Fair; Recent Fair  Judgment: Poor  Insight: Fair   Chartered Certified Accountant: Fair  Attention Span: Fair  Recall: [employer] of Knowledge: Fair  Language: Fair   Psychomotor Activity  Psychomotor Activity: Psychomotor Activity: Normal  Musculoskeletal: Strength & Muscle Tone: within normal limits Gait & Station: normal Assets  Assets: Housing; Vocational/Educational    Physical Exam: Physical Exam Pulmonary:     Effort: Pulmonary effort is normal.  Neurological:     Mental Status: He is alert and oriented to person, place, and time.    Review of Systems  Respiratory:  Negative for shortness of breath.   Cardiovascular:  Negative for chest pain.  Gastrointestinal:  Negative for nausea and vomiting.  Neurological:  Negative for dizziness, loss of consciousness and headaches.  Psychiatric/Behavioral:  Positive for depression. Negative for hallucinations and suicidal  ideas.    Blood pressure 131/89, pulse 73, temperature 97.8 F (36.6 C), temperature source Oral, resp. rate 18, SpO2 95%. There is no height or weight on file to calculate BMI.  Diagnosis: Principal Problem:   MDD (major depressive disorder)   PLAN: Safety and Monitoring:  -- Voluntary admission to inpatient psychiatric unit for safety, stabilization and treatment  -- Daily contact with patient to assess and evaluate symptoms and progress in treatment  -- Patient's case to be discussed in multi-disciplinary team meeting  -- Observation Level : q15 minute checks  -- Vital signs:  q12 hours  -- Precautions: suicide, elopement, and assault -- Encouraged patient to participate in unit milieu and in scheduled group therapies  2. Psychiatric Treatment:  Scheduled Medications: -Continue previously prescribed Prozac  20 mg daily and Wellbutrin  150 mg extended release daily.  During admission team has added Abilify  2 mg daily to augment treatment and help further address patient's symptoms, we will continue with this regimen at this time.    -- The risks/benefits/side-effects/alternatives to this medication were discussed in detail with the patient and time was given for questions. The patient consents to medication trial.  3. Medical Issues Being Addressed:  - Restarted patient's previously prescribed amlodipine  at 5 mg daily to target elevated blood pressure readings and patient's noted history of hypertension   4. Discharge Planning:   -- Social work and case management to assist with discharge planning and identification of hospital follow-up needs prior to discharge  -- Estimated LOS: 3-4 days Zelda Sharps, NP This note was created using [employer]. Please excuse any inadvertent transcription errors. Case was discussed with supervising physician Dr. Jadapalle who is agreeable with current plan.

## 2024-09-08 NOTE — Group Note (Signed)
 Date:  09/08/2024 Time:  9:19 PM  Group Topic/Focus:  Healthy Communication:   The focus of this group is to discuss communication, barriers to communication, as well as healthy ways to communicate with others.    Participation Level:  Active  Participation Quality:  Appropriate  Affect:  Appropriate  Cognitive:  Appropriate  Insight: Appropriate  Engagement in Group:  Engaged  Modes of Intervention:  Discussion and Education  Additional Comments:    Poonam Woehrle L 09/08/2024, 9:19 PM

## 2024-09-08 NOTE — Plan of Care (Signed)

## 2024-09-08 NOTE — Group Note (Signed)
 Recreation Therapy Group Note   Group Topic:Leisure Education  Group Date: 09/08/2024 Start Time: 1030 End Time: 1140 Facilitators: Celestia Jeoffrey BRAVO, LRT, CTRS Location: Craft Room  Group Description: Leisure. Patients were given the option to choose from journaling, coloring, drawing, making origami, playing with playdoh, listening to music or singing karaoke. LRT and pts discussed the meaning of leisure, the importance of participating in leisure during their free time/when they're outside of the hospital, as well as how our leisure interests can also serve as coping skills.   Goal Area(s) Addressed:  Patient will identify a current leisure interest.  Patient will learn the definition of leisure. Patient will practice making a positive decision. Patient will have the opportunity to try a new leisure activity. Patient will communicate with peers and LRT.    Affect/Mood: Flat   Participation Level: Minimal    Clinical Observations/Individualized Feedback: Garrel came late to group. Pt was present for less than half the session.   Plan: Continue to engage patient in RT group sessions 2-3x/week.   Jeoffrey BRAVO Celestia, LRT, CTRS 09/08/2024 11:46 AM

## 2024-09-08 NOTE — Plan of Care (Signed)
  Problem: Education: Goal: Knowledge of [facility] Education information/materials will improve Outcome: Progressing Goal: Emotional status will improve Outcome: Progressing Goal: Mental status will improve Outcome: Progressing Goal: Verbalization of understanding the information provided will improve Outcome: Progressing   Problem: Activity: Goal: Interest or engagement in activities will improve Outcome: Progressing Goal: Sleeping patterns will improve Outcome: Progressing   Problem: Coping: Goal: Ability to verbalize frustrations and anger appropriately will improve Outcome: Progressing Goal: Ability to demonstrate self-control will improve Outcome: Progressing   Problem: Health Behavior/Discharge Planning: Goal: Identification of resources available to assist in meeting health care needs will improve Outcome: Progressing Goal: Compliance with treatment plan for underlying cause of condition will improve Outcome: Progressing   Problem: Physical Regulation: Goal: Ability to maintain clinical measurements within normal limits will improve Outcome: Progressing   Problem: Safety: Goal: Periods of time without injury will increase Outcome: Progressing   Problem: Education: Goal: Utilization of techniques to improve thought processes will improve Outcome: Progressing Goal: Knowledge of the prescribed therapeutic regimen will improve Outcome: Progressing   Problem: Activity: Goal: Interest or engagement in leisure activities will improve Outcome: Progressing Goal: Imbalance in normal sleep/wake cycle will improve Outcome: Progressing   Problem: Coping: Goal: Coping ability will improve Outcome: Progressing Goal: Will verbalize feelings Outcome: Progressing   Problem: Health Behavior/Discharge Planning: Goal: Ability to make decisions will improve Outcome: Progressing Goal: Compliance with therapeutic regimen will improve Outcome: Progressing    Problem: Role Relationship: Goal: Will demonstrate positive changes in social behaviors and relationships Outcome: Progressing   Problem: Safety: Goal: Ability to disclose and discuss suicidal ideas will improve Outcome: Progressing Goal: Ability to identify and utilize support systems that promote safety will improve Outcome: Progressing   Problem: Self-Concept: Goal: Will verbalize positive feelings about self Outcome: Progressing Goal: Level of anxiety will decrease Outcome: Progressing   Problem: Education: Goal: Knowledge of disease or condition will improve Outcome: Progressing Goal: Understanding of discharge needs will improve Outcome: Progressing   Problem: Health Behavior/Discharge Planning: Goal: Ability to identify changes in lifestyle to reduce recurrence of condition will improve Outcome: Progressing Goal: Identification of resources available to assist in meeting health care needs will improve Outcome: Progressing   Problem: Physical Regulation: Goal: Complications related to the disease process, condition or treatment will be avoided or minimized Outcome: Progressing   Problem: Safety: Goal: Ability to remain free from injury will improve Outcome: Progressing   

## 2024-09-09 DIAGNOSIS — F329 Major depressive disorder, single episode, unspecified: Secondary | ICD-10-CM | POA: Diagnosis not present

## 2024-09-09 MED ORDER — PNEUMOCOCCAL 20-VAL CONJ VACC 0.5 ML IM SUSY
0.5000 mL | PREFILLED_SYRINGE | INTRAMUSCULAR | Status: DC
  Filled 2024-09-09 (×2): qty 0.5

## 2024-09-09 MED ORDER — QUETIAPINE FUMARATE 25 MG PO TABS
25.0000 mg | ORAL_TABLET | Freq: Every day | ORAL | Status: DC
  Administered 2024-09-10 – 2024-09-11 (×2): 25 mg via ORAL
  Filled 2024-09-09 (×2): qty 1

## 2024-09-09 NOTE — Plan of Care (Signed)
 Javi Bollman is a 64 y.o. adult patient. No diagnosis found. Past Medical History:  Diagnosis Date   ETOH abuse    Current Facility-Administered Medications  Medication Dose Route Frequency Provider Last Rate Last Admin   acetaminophen  (TYLENOL ) tablet 650 mg  650 mg Oral Q6H PRN Smith, Annie B, NP   650 mg at 09/08/24 2128   alum & mag hydroxide-simeth (MAALOX/MYLANTA) 200-200-20 MG/5ML suspension 30 mL  30 mL Oral Q4H PRN Smith, Annie B, NP       amLODipine  (NORVASC ) tablet 5 mg  5 mg Oral Daily Smith, Annie B, NP   5 mg at 09/08/24 1501   ARIPiprazole  (ABILIFY ) tablet 2 mg  2 mg Oral Daily May, Tanya, NP   2 mg at 09/08/24 9149   buPROPion  (WELLBUTRIN  XL) 24 hr tablet 150 mg  150 mg Oral Daily May, Tanya, NP   150 mg at 09/08/24 9149   FLUoxetine  (PROZAC ) capsule 20 mg  20 mg Oral Daily Smith, Annie B, NP   20 mg at 09/08/24 9163   folic acid  (FOLVITE ) tablet 1 mg  1 mg Oral Daily Smith, Annie B, NP   1 mg at 09/08/24 9149   magnesium  hydroxide (MILK OF MAGNESIA) suspension 30 mL  30 mL Oral Daily PRN Smith, Annie B, NP       multivitamin with minerals tablet 1 tablet  1 tablet Oral Daily Smith, Annie B, NP   1 tablet at 09/08/24 9149   OLANZapine  (ZYPREXA ) injection 5 mg  5 mg Intramuscular TID PRN Smith, Annie B, NP       OLANZapine  (ZYPREXA ) tablet 5 mg  5 mg Oral TID PRN Smith, Annie B, NP   5 mg at 09/08/24 2129   [START ON 09/10/2024] pneumococcal 20-valent conjugate vaccine (PREVNAR 20 ) injection 0.5 mL  0.5 mL Intramuscular Tomorrow-1000 Donnelly Mellow, MD       thiamine  (VITAMIN B1) tablet 100 mg  100 mg Oral Daily Smith, Annie B, NP   100 mg at 09/08/24 9149   Or   thiamine  (VITAMIN B1) injection 100 mg  100 mg Intravenous Daily Smith, Annie B, NP       Allergies[1] Principal Problem:   MDD (major depressive disorder)  Blood pressure 126/86, pulse 69, temperature 97.8 F (36.6 C), temperature source Oral, resp. rate 18, SpO2 96%.    Chase Knebel B Lenn Volker 09/09/2024       [1] No Known Allergies

## 2024-09-09 NOTE — Progress Notes (Signed)
 [facility] MD Progress Note  09/09/2024 11:47 AM Nicholas Durham  MRN:  [id number]   Subjective:  Chart reviewed, case discussed in multidisciplinary meeting, patient seen during rounds.   Patient seen on rounds today on inpatient psychiatric unit. Denies current suicidal ideations, homicidal ideations, auditory hallucinations, and visual hallucinations. Reports doing overall okay. Recently initiated on Abilify  to augment existing regimen of Prozac  and Wellbutrin . Patient reports tolerating medication well without noticeable side effects at this time. Objectively, no evidence of medication-related side effects noted on examination including absence of extrapyramidal symptoms, akathisia, or other movement disorders. Patient reports he was previously prescribed amlodipine  for blood pressure control but has been without this medication for some time. Review of flowsheets reveals elevated blood pressure readings while on unit including 129/90, 148/86, and 131/89 among others, indicating inadequate blood pressure control. This provider contacted [facility] which confirmed patient was previously prescribed amlodipine  10 mg daily with last fill date of September 2025 for 90-day supply. Given elevated blood pressure readings and previous prescription history, amlodipine  will be reinitiated at 5 mg daily with plan to titrate further as needed based on blood pressure trends and clinical response. Patient denies associated symptoms including chest pain, shortness of breath, headaches, dizziness, or syncopal episodes. Patient continues to demonstrate depressed affect but remains cooperative with psychiatry assessment and treatment plan.  09/09/2024: Patient seen on rounds today by psychiatry.  Patient reported mood was about the same.  He reported he feels as he always feels which is down.  Patient denied current suicidal or homicidal ideations as well as auditory visual hallucinations but continued to  have depressed affect.  Patient denied any noted side effects from current medication regimen including restlessness, headache, nausea vomiting, dizziness, excessive sedation or stomach discomfort.  Patient endorsed poor sleep however he reported he only gets about 4 to 5 hours nightly.  He reported his appetite is okay.  Patient reported previously had been prescribed Seroquel  for adjunct of treatment of depression as well as to help with sleep.  We discussed the risk, benefits, side effects of switching Abilify  to the Seroquel  to which patient verbalized understanding.  Patient voiced preference and gave informed consent to make the above changes.  Since patient had already taken his Abilify  dose today we will discontinue the Abilify  and begin Seroquel  dosing tomorrow night.  On today's exam, objectively there was no evidence of mania, psychosis, or patient responding to internal stimuli currently.    Past Psychiatric History: see h&P Family History: History reviewed. No pertinent family history. Social History:  Social History   Substance and Sexual Activity  Alcohol Use Yes   Comment: Stopped drinking 01/12/19     Social History   Substance and Sexual Activity  Drug Use Yes   Types: Marijuana    Social History   Socioeconomic History   Marital status: Unknown    Spouse name: Not on file   Number of children: Not on file   Years of education: Not on file   Highest education level: Not on file  Occupational History   Not on file  Tobacco Use   Smoking status: Every Day    Current packs/day: 0.50    Types: Cigarettes   Smokeless tobacco: Never  Substance and Sexual Activity   Alcohol use: Yes    Comment: Stopped drinking 01/12/19   Drug use: Yes    Types: Marijuana   Sexual activity: Not on file  Other Topics Concern   Not on file  Social History Narrative   Not on file   Social Drivers of Health   Tobacco Use: High Risk (09/06/2024)   Patient History    Smoking  Tobacco Use: Every Day    Smokeless Tobacco Use: Never    Passive Exposure: Not on file  Financial Resource Strain: High Risk (08/12/2022)   Received from [facility]   Overall Financial Resource Strain (CARDIA)    Difficulty of Paying Living Expenses: Very hard  Food Insecurity: No Food Insecurity (09/06/2024)   Epic    Worried About Programme Researcher, Broadcasting/film/video in the Last Year: Never true    Ran Out of Food in the Last Year: Never true  Transportation Needs: No Transportation Needs (09/06/2024)   Epic    Lack of Transportation (Medical): No    Lack of Transportation (Non-Medical): No  Physical Activity: Not on file  Stress: Not on file  Social Connections: Not on file  Depression (EYV7-0): Not on file  Alcohol Screen: Medium Risk (09/06/2024)   Alcohol Screen    Last Alcohol Screening Score (AUDIT): 14  Housing: Low Risk (09/06/2024)   Epic    Unable to Pay for Housing in the Last Year: No    Number of Times Moved in the Last Year: 0    Homeless in the Last Year: No  Utilities: Not At Risk (09/06/2024)   Epic    Threatened with loss of utilities: No  Health Literacy: Not on file   Past Medical History:  Past Medical History:  Diagnosis Date   ETOH abuse     Past Surgical History:  Procedure Laterality Date   APPENDECTOMY      Current Medications: Current Facility-Administered Medications  Medication Dose Route Frequency Provider Last Rate Last Admin   acetaminophen  (TYLENOL ) tablet 650 mg  650 mg Oral Q6H PRN Mc Bloodworth B, NP   650 mg at 09/09/24 0811   alum & mag hydroxide-simeth (MAALOX/MYLANTA) 200-200-20 MG/5ML suspension 30 mL  30 mL Oral Q4H PRN Joani Cosma B, NP       amLODipine  (NORVASC ) tablet 5 mg  5 mg Oral Daily Termaine Roupp B, NP   5 mg at 09/09/24 9188   ARIPiprazole  (ABILIFY ) tablet 2 mg  2 mg Oral Daily May, Tanya, NP   2 mg at 09/09/24 0813   buPROPion  (WELLBUTRIN  XL) 24 hr tablet 150 mg  150 mg Oral Daily May, Tanya, NP   150 mg at 09/09/24 9187    FLUoxetine  (PROZAC ) capsule 20 mg  20 mg Oral Daily Annalynne Ibanez B, NP   20 mg at 09/09/24 9187   folic acid  (FOLVITE ) tablet 1 mg  1 mg Oral Daily Mailynn Everly B, NP   1 mg at 09/09/24 9188   magnesium  hydroxide (MILK OF MAGNESIA) suspension 30 mL  30 mL Oral Daily PRN Esaiah Wanless B, NP       multivitamin with minerals tablet 1 tablet  1 tablet Oral Daily Larry Alcock B, NP   1 tablet at 09/09/24 9188   OLANZapine  (ZYPREXA ) injection 5 mg  5 mg Intramuscular TID PRN Davita Sublett B, NP       OLANZapine  (ZYPREXA ) tablet 5 mg  5 mg Oral TID PRN Carrol Bondar B, NP   5 mg at 09/08/24 2129   [START ON 09/10/2024] pneumococcal 20-valent conjugate vaccine (PREVNAR 20 ) injection 0.5 mL  0.5 mL Intramuscular Tomorrow-1000 Donnelly Mellow, MD       thiamine  (VITAMIN B1) tablet 100 mg  100 mg  Oral Daily Ferrah Panagopoulos B, NP   100 mg at 09/09/24 9188   Or   thiamine  (VITAMIN B1) injection 100 mg  100 mg Intravenous Daily Nargis Abrams B, NP        Lab Results:  Results for orders placed or performed during the hospital encounter of 09/06/24 (from the past 48 hours)  Hemoglobin A1c     Status: None   Collection Time: 09/08/24  9:43 AM  Result Value Ref Range   Hgb A1c MFr Bld 5.3 4.8 - 5.6 %    Comment: (NOTE) Diagnosis of Diabetes The following HbA1c ranges recommended by the American Diabetes Association (ADA) may be used as an aid in the diagnosis of diabetes mellitus.  Hemoglobin             Suggested A1C NGSP%              Diagnosis  <5.7                   Non Diabetic  5.7-6.4                Pre-Diabetic  >6.4                   Diabetic  <7.0                   Glycemic control for                       adults with diabetes.     Mean Plasma Glucose 105.41 mg/dL    Comment: Performed at [facility], [address] [place], [place] [address]  Lipid panel     Status: Abnormal   Collection Time: 09/08/24  9:43 AM  Result Value Ref Range   Cholesterol 138 0 - 200 mg/dL    Comment:         ATP III CLASSIFICATION:  <200     mg/dL   Desirable  799-760  mg/dL   Borderline High  >=759    mg/dL   High           Triglycerides 85 <150 mg/dL   HDL 32 (L) >59 mg/dL   Total CHOL/HDL Ratio 4.4 RATIO   VLDL 17 0 - 40 mg/dL   LDL Cholesterol 89 0 - 99 mg/dL    Comment:        Total Cholesterol/HDL:CHD Risk Coronary Heart Disease Risk Table                     Men   Women  1/2 Average Risk   3.4   3.3  Average Risk       5.0   4.4  2 X Average Risk   9.6   7.1  3 X Average Risk  23.4   11.0        Use the calculated Patient Ratio above and the CHD Risk Table to determine the patient's CHD Risk.        ATP III CLASSIFICATION (LDL):  <100     mg/dL   Optimal  899-870  mg/dL   Near or Above                    Optimal  130-159  mg/dL   Borderline  839-810  mg/dL   High  >809     mg/dL   Very High Performed at [facility], [address], [place], [place] [address]   TSH     Status: None   Collection Time: 09/08/24  9:43 AM  Result Value Ref Range   TSH 0.603 0.350 - 4.500 uIU/mL    Comment: Performed at [facility], [address] [place], [place] [address]    Blood Alcohol level:  Lab Results  Component Value Date   ETH 147 (H) 09/05/2024    Metabolic Disorder Labs: Lab Results  Component Value Date   HGBA1C 5.3 09/08/2024   MPG 105.41 09/08/2024   No results found for: PROLACTIN Lab Results  Component Value Date   CHOL 138 09/08/2024   TRIG 85 09/08/2024   HDL 32 (L) 09/08/2024   CHOLHDL 4.4 09/08/2024   VLDL 17 09/08/2024   LDLCALC 89 09/08/2024    Physical Findings: AIMS:  , ,  ,  ,    CIWA:  CIWA-Ar Total: 1 COWS:      Psychiatric Specialty Exam:  Presentation  General Appearance:  Disheveled  Eye Contact: Minimal  Speech: Clear and Coherent  Speech Volume: Decreased    Mood and Affect  Mood: Dysphoric; Depressed  Affect: Depressed   Thought Process  Thought  Processes: Coherent  Orientation:Full (Time, Place and Person)  Thought Content:Logical  Hallucinations:No data recorded  Ideas of Reference:None  Suicidal Thoughts:No data recorded  Homicidal Thoughts:No data recorded   Sensorium  Memory: Immediate Fair; Recent Fair  Judgment: Poor  Insight: Fair   Chartered Certified Accountant: Fair  Attention Span: Fair  Recall: [employer] of Knowledge: Fair  Language: Fair   Psychomotor Activity  Psychomotor Activity: No data recorded  Musculoskeletal: Strength & Muscle Tone: within normal limits Gait & Station: normal Assets  Assets: Housing; Vocational/Educational    Physical Exam: Physical Exam Pulmonary:     Effort: Pulmonary effort is normal.  Neurological:     Mental Status: He is alert and oriented to person, place, and time.    Review of Systems  Respiratory:  Negative for shortness of breath.   Cardiovascular:  Negative for chest pain.  Gastrointestinal:  Negative for nausea and vomiting.  Neurological:  Negative for dizziness, loss of consciousness and headaches.  Psychiatric/Behavioral:  Positive for depression. Negative for hallucinations and suicidal ideas.    Blood pressure (!) 141/104, pulse (!) 52, temperature 97.8 F (36.6 C), temperature source Oral, resp. rate 18, SpO2 97%. There is no height or weight on file to calculate BMI.  Diagnosis: Principal Problem:   MDD (major depressive disorder)   PLAN: Safety and Monitoring:  -- Voluntary admission to inpatient psychiatric unit for safety, stabilization and treatment  -- Daily contact with patient to assess and evaluate symptoms and progress in treatment  -- Patient's case to be discussed in multi-disciplinary team meeting  -- Observation Level : q15 minute checks  -- Vital signs:  q12 hours  -- Precautions: suicide, elopement, and assault -- Encouraged patient to participate in unit milieu and in scheduled group therapies   2. Psychiatric Treatment:  Scheduled Medications: -Continue previously prescribed Prozac  20 mg daily and Wellbutrin  150 mg extended release daily.  During admission team has added Abilify  2 mg daily to augment treatment and help further address patient's symptoms, we will continue with this regimen at this time.   *Will be discontinuing Abilify  and switching to Seroquel  25 mg nightly starting tomorrow 09/10/2024 due to patient already taking today's dose of Abilify .  Seroquel  being added as adjunct of treatment to help target patient's continued  depressive symptoms as well as insomnia.   -- The risks/benefits/side-effects/alternatives to this medication were discussed in detail with the patient and time was given for questions. The patient consents to medication trial.  3. Medical Issues Being Addressed:  - Restarted patient's previously prescribed amlodipine  at 5 mg daily to target elevated blood pressure readings and patient's noted history of hypertension - Patient did have noted elevated blood pressure reading this morning, we will plan to increase patient's amlodipine  to 10 mg his normal daily if continued elevated readings.  Patient denied any associated symptoms including dizziness, headaches, chest pain, shortness of breath or LOC.  We will continue to monitor   4. Discharge Planning:   -- Social work and case management to assist with discharge planning and identification of hospital follow-up needs prior to discharge  -- Estimated LOS: 3-4 days Zelda Sharps, NP This note was created using [employer]. Please excuse any inadvertent transcription errors. Case was discussed with supervising physician Dr. Ruther who is agreeable with current plan.

## 2024-09-09 NOTE — Plan of Care (Signed)
  Problem: Education: Goal: Knowledge of [facility] Education information/materials will improve Outcome: Progressing Goal: Emotional status will improve Outcome: Progressing Goal: Mental status will improve Outcome: Progressing Goal: Verbalization of understanding the information provided will improve Outcome: Progressing   Problem: Activity: Goal: Interest or engagement in activities will improve Outcome: Progressing Goal: Sleeping patterns will improve Outcome: Progressing   Problem: Coping: Goal: Ability to verbalize frustrations and anger appropriately will improve Outcome: Progressing Goal: Ability to demonstrate self-control will improve Outcome: Progressing   Problem: Health Behavior/Discharge Planning: Goal: Identification of resources available to assist in meeting health care needs will improve Outcome: Progressing Goal: Compliance with treatment plan for underlying cause of condition will improve Outcome: Progressing   Problem: Physical Regulation: Goal: Ability to maintain clinical measurements within normal limits will improve Outcome: Progressing   Problem: Safety: Goal: Periods of time without injury will increase Outcome: Progressing   Problem: Education: Goal: Utilization of techniques to improve thought processes will improve Outcome: Progressing Goal: Knowledge of the prescribed therapeutic regimen will improve Outcome: Progressing   Problem: Activity: Goal: Interest or engagement in leisure activities will improve Outcome: Progressing Goal: Imbalance in normal sleep/wake cycle will improve Outcome: Progressing   Problem: Coping: Goal: Coping ability will improve Outcome: Progressing Goal: Will verbalize feelings Outcome: Progressing   Problem: Health Behavior/Discharge Planning: Goal: Ability to make decisions will improve Outcome: Progressing Goal: Compliance with therapeutic regimen will improve Outcome: Progressing    Problem: Role Relationship: Goal: Will demonstrate positive changes in social behaviors and relationships Outcome: Progressing   Problem: Safety: Goal: Ability to disclose and discuss suicidal ideas will improve Outcome: Progressing Goal: Ability to identify and utilize support systems that promote safety will improve Outcome: Progressing   Problem: Self-Concept: Goal: Will verbalize positive feelings about self Outcome: Progressing Goal: Level of anxiety will decrease Outcome: Progressing   Problem: Education: Goal: Knowledge of disease or condition will improve Outcome: Progressing Goal: Understanding of discharge needs will improve Outcome: Progressing   Problem: Health Behavior/Discharge Planning: Goal: Ability to identify changes in lifestyle to reduce recurrence of condition will improve Outcome: Progressing Goal: Identification of resources available to assist in meeting health care needs will improve Outcome: Progressing   Problem: Physical Regulation: Goal: Complications related to the disease process, condition or treatment will be avoided or minimized Outcome: Progressing   Problem: Safety: Goal: Ability to remain free from injury will improve Outcome: Progressing   

## 2024-09-09 NOTE — Group Note (Signed)
 Date:  09/09/2024 Time:  9:48 PM  Group Topic/Focus:  Managing Feelings:   The focus of this group is to identify what feelings patients have difficulty handling and develop a plan to handle them in a healthier way upon discharge. Self Care:   The focus of this group is to help patients understand the importance of self-care in order to improve or restore emotional, physical, spiritual, interpersonal, and financial health. Wrap-Up Group:   The focus of this group is to help patients review their daily goal of treatment and discuss progress on daily workbooks.    Participation Level:  Active  Participation Quality:  Appropriate and Attentive  Affect:  Appropriate  Cognitive:  Alert, Appropriate, and Oriented  Insight: Appropriate and Good  Engagement in Group:  Engaged  Modes of Intervention:  Discussion and Support  Additional Comments:  N/A  Butler LITTIE Gelineau 09/09/2024, 9:48 PM

## 2024-09-09 NOTE — Progress Notes (Signed)
" °   09/09/24 1200  Psych Admission Type (Psych Patients Only)  Admission Status Involuntary  Psychosocial Assessment  Patient Complaints Anxiety  Eye Contact Fair  Facial Expression Flat  Affect Appropriate to circumstance  Speech Logical/coherent  Interaction No initiation  Motor Activity Slow  Appearance/Hygiene In scrubs  Behavior Characteristics Cooperative  Mood Sad  Aggressive Behavior  Effect No apparent injury  Thought Process  Coherency WDL  Content WDL  Delusions None reported or observed  Perception WDL  Hallucination None reported or observed  Judgment Limited  Confusion None  Danger to Self  Current suicidal ideation?  (Denies)  Description of Suicide Plan no plan  Agreement Not to Harm Self Yes  Description of Agreement Verbal  Danger to Others  Danger to Others None reported or observed    "

## 2024-09-10 DIAGNOSIS — F329 Major depressive disorder, single episode, unspecified: Secondary | ICD-10-CM | POA: Diagnosis not present

## 2024-09-10 NOTE — Plan of Care (Signed)
" °  Problem: Education: Goal: Emotional status will improve 09/10/2024 1644 by Waddell Oddis BIRCH, RN Outcome: Progressing 09/10/2024 1643 by Waddell Oddis BIRCH, RN Outcome: Progressing Goal: Mental status will improve 09/10/2024 1644 by Waddell Oddis BIRCH, RN Outcome: Progressing 09/10/2024 1643 by Waddell Oddis BIRCH, RN Outcome: Progressing Goal: Verbalization of understanding the information provided will improve 09/10/2024 1644 by Waddell Oddis BIRCH, RN Outcome: Progressing 09/10/2024 1643 by Waddell Oddis BIRCH, RN Outcome: Progressing   Problem: Activity: Goal: Interest or engagement in activities will improve 09/10/2024 1644 by Waddell Oddis BIRCH, RN Outcome: Progressing 09/10/2024 1643 by Waddell Oddis BIRCH, RN Outcome: Progressing Goal: Sleeping patterns will improve 09/10/2024 1644 by Waddell Oddis BIRCH, RN Outcome: Progressing 09/10/2024 1643 by Waddell Oddis BIRCH, RN Outcome: Progressing   Problem: Coping: Goal: Ability to verbalize frustrations and anger appropriately will improve 09/10/2024 1644 by Waddell Oddis BIRCH, RN Outcome: Progressing 09/10/2024 1643 by Waddell Oddis BIRCH, RN Outcome: Progressing Goal: Ability to demonstrate self-control will improve 09/10/2024 1644 by Waddell Oddis BIRCH, RN Outcome: Progressing 09/10/2024 1643 by Waddell Oddis BIRCH, RN Outcome: Progressing   Problem: Health Behavior/Discharge Planning: Goal: Compliance with treatment plan for underlying cause of condition will improve 09/10/2024 1644 by Waddell Oddis BIRCH, RN Outcome: Progressing 09/10/2024 1643 by Waddell Oddis BIRCH, RN Outcome: Progressing   "

## 2024-09-10 NOTE — Group Note (Signed)
 Date:  09/10/2024 Time:  9:06 PM  Group Topic/Focus:  Wrap-Up Group:   The focus of this group is to help patients review their daily goal of treatment and discuss progress on daily workbooks.    Participation Level:  Active  Participation Quality:  Appropriate, Attentive, Sharing, and Supportive  Affect:  Appropriate  Cognitive:  Appropriate  Insight: Appropriate  Engagement in Group:  Engaged and Supportive  Modes of Intervention:  Discussion  Additional Comments:     Kerri Katz 09/10/2024, 9:06 PM

## 2024-09-10 NOTE — Progress Notes (Signed)
 Patient ID: Nicholas Durham, adult   DOB: [birth year], 64 y.o.   MRN: [id number]  0740 Pt A/O x4 with noted anxiety. Pt able to verbalize needs. Pt C/O of agitation and anxiety.  0750 PRN Zyprexa  given for agitation and anxiety. 0830 med effective. Monitoring continues during 7a-7p shift. No noted distress.

## 2024-09-10 NOTE — Group Note (Signed)
 [facility] LCSW Group Therapy Note   Group Date: 09/10/2024 Start Time: 1400 End Time: 1500   Type of Therapy/Topic:  Group Therapy:  Emotion Regulation  Participation Level:  Active   Mood: pleasant, calm  Description of Group:    The purpose of this group is to assist patients in learning to regulate negative emotions and experience positive emotions. Patients will be guided to discuss ways in which they have been vulnerable to their negative emotions. These vulnerabilities will be juxtaposed with experiences of positive emotions or situations, and patients challenged to use positive emotions to combat negative ones. Special emphasis will be placed on coping with negative emotions in conflict situations, and patients will process healthy conflict resolution skills.  Therapeutic Goals: Patient will identify two positive emotions or experiences to reflect on in order to balance out negative emotions:  Patient will label two or more emotions that they find the most difficult to experience:  Patient will be able to demonstrate positive conflict resolution skills through discussion or role plays:   Summary of Patient Progress:  The facilitator and patient discussed how negative emotions can effect outcomes in their communication and feelings. Group members chose an emotion on how they were feeling when they were misunderstood and their reaction.  Therapeutic Modalities:   Cognitive Behavioral Therapy Feelings Identification Dialectical Behavioral Therapy   Rexene LELON Mae, LCSWA

## 2024-09-10 NOTE — Group Note (Signed)
 Date:  09/10/2024 Time:  11:07 AM  Group Topic/Focus:  Goals Group:   The focus of this group is to help patients establish daily goals to achieve during treatment and discuss how the patient can incorporate goal setting into their daily lives to aide in recovery.    Participation Level:  Did Not Attend   Veretta Sabourin L Delaynie Stetzer 09/10/2024, 11:07 AM

## 2024-09-10 NOTE — Plan of Care (Signed)
   Problem: Education: Goal: Emotional status will improve Outcome: Progressing Goal: Mental status will improve Outcome: Progressing

## 2024-09-10 NOTE — Progress Notes (Signed)
" °   09/10/24 2115  Psychosocial Assessment  Patient Complaints Anxiety  Eye Contact Fair  Facial Expression Flat  Affect Appropriate to circumstance  Speech Logical/coherent  Interaction Minimal  Motor Activity Slow  Appearance/Hygiene In scrubs  Behavior Characteristics Appropriate to situation  Mood Sad  Thought Process  Coherency WDL  Content WDL  Delusions None reported or observed  Perception WDL  Hallucination None reported or observed  Judgment WDL  Confusion WDL  Danger to Self  Current suicidal ideation? Denies  Agreement Not to Harm Self Yes  Description of Agreement verbal    "

## 2024-09-10 NOTE — Progress Notes (Signed)
 [facility] MD Progress Note  09/10/2024 11:03 AM Nicholas Durham  MRN:  [id number]   Subjective:  Chart reviewed, case discussed in multidisciplinary meeting, patient seen during rounds.   Patient seen on rounds today on inpatient psychiatric unit. Denies current suicidal ideations, homicidal ideations, auditory hallucinations, and visual hallucinations. Reports doing overall okay. Recently initiated on Abilify  to augment existing regimen of Prozac  and Wellbutrin . Patient reports tolerating medication well without noticeable side effects at this time. Objectively, no evidence of medication-related side effects noted on examination including absence of extrapyramidal symptoms, akathisia, or other movement disorders. Patient reports he was previously prescribed amlodipine  for blood pressure control but has been without this medication for some time. Review of flowsheets reveals elevated blood pressure readings while on unit including 129/90, 148/86, and 131/89 among others, indicating inadequate blood pressure control. This provider contacted [facility] which confirmed patient was previously prescribed amlodipine  10 mg daily with last fill date of September 2025 for 90-day supply. Given elevated blood pressure readings and previous prescription history, amlodipine  will be reinitiated at 5 mg daily with plan to titrate further as needed based on blood pressure trends and clinical response. Patient denies associated symptoms including chest pain, shortness of breath, headaches, dizziness, or syncopal episodes. Patient continues to demonstrate depressed affect but remains cooperative with psychiatry assessment and treatment plan.  09/09/2024: Patient seen on rounds today by psychiatry.  Patient reported mood was about the same.  He reported he feels as he always feels which is down.  Patient denied current suicidal or homicidal ideations as well as auditory visual hallucinations but continued to  have depressed affect.  Patient denied any noted side effects from current medication regimen including restlessness, headache, nausea vomiting, dizziness, excessive sedation or stomach discomfort.  Patient endorsed poor sleep however he reported he only gets about 4 to 5 hours nightly.  He reported his appetite is okay.  Patient reported previously had been prescribed Seroquel  for adjunct of treatment of depression as well as to help with sleep.  We discussed the risk, benefits, side effects of switching Abilify  to the Seroquel  to which patient verbalized understanding.  Patient voiced preference and gave informed consent to make the above changes.  Since patient had already taken his Abilify  dose today we will discontinue the Abilify  and begin Seroquel  dosing tomorrow night.  On today's exam, objectively there was no evidence of mania, psychosis, or patient responding to internal stimuli currently.  09/10/2024: On rounds today, patient expressed desire to go home.  However we discussed the need for further monitoring due to recent medication changes as listed above.  Patient verbalized understanding.  Today will be the first day that patient will take Seroquel  at night to target mood, ongoing depression and insomnia.  Per review of chart, it appears patient was given 5 mg Zyprexa  this morning.  Nursing staff reported this was due to anxiety/agitation.  On today's exam, patient denied suicidal or homicidal ideations, as well as auditory or visual hallucinations.    Past Psychiatric History: see h&P Family History: History reviewed. No pertinent family history. Social History:  Social History   Substance and Sexual Activity  Alcohol Use Yes   Comment: Stopped drinking 01/12/19     Social History   Substance and Sexual Activity  Drug Use Yes   Types: Marijuana    Social History   Socioeconomic History   Marital status: Unknown    Spouse name: Not on file   Number of children: Not on  file    Years of education: Not on file   Highest education level: Not on file  Occupational History   Not on file  Tobacco Use   Smoking status: Every Day    Current packs/day: 0.50    Types: Cigarettes   Smokeless tobacco: Never  Substance and Sexual Activity   Alcohol use: Yes    Comment: Stopped drinking 01/12/19   Drug use: Yes    Types: Marijuana   Sexual activity: Not on file  Other Topics Concern   Not on file  Social History Narrative   Not on file   Social Drivers of Health   Tobacco Use: High Risk (09/06/2024)   Patient History    Smoking Tobacco Use: Every Day    Smokeless Tobacco Use: Never    Passive Exposure: Not on file  Financial Resource Strain: High Risk (08/12/2022)   Received from [facility]   Overall Financial Resource Strain (CARDIA)    Difficulty of Paying Living Expenses: Very hard  Food Insecurity: No Food Insecurity (09/06/2024)   Epic    Worried About Radiation Protection Practitioner of Food in the Last Year: Never true    Ran Out of Food in the Last Year: Never true  Transportation Needs: No Transportation Needs (09/06/2024)   Epic    Lack of Transportation (Medical): No    Lack of Transportation (Non-Medical): No  Physical Activity: Not on file  Stress: Not on file  Social Connections: Not on file  Depression (EYV7-0): Not on file  Alcohol Screen: Medium Risk (09/06/2024)   Alcohol Screen    Last Alcohol Screening Score (AUDIT): 14  Housing: Low Risk (09/06/2024)   Epic    Unable to Pay for Housing in the Last Year: No    Number of Times Moved in the Last Year: 0    Homeless in the Last Year: No  Utilities: Not At Risk (09/06/2024)   Epic    Threatened with loss of utilities: No  Health Literacy: Not on file   Past Medical History:  Past Medical History:  Diagnosis Date   ETOH abuse     Past Surgical History:  Procedure Laterality Date   APPENDECTOMY      Current Medications: Current Facility-Administered Medications  Medication Dose Route Frequency  Provider Last Rate Last Admin   acetaminophen  (TYLENOL ) tablet 650 mg  650 mg Oral Q6H PRN Kaylie Ritter B, NP   650 mg at 09/10/24 0747   alum & mag hydroxide-simeth (MAALOX/MYLANTA) 200-200-20 MG/5ML suspension 30 mL  30 mL Oral Q4H PRN Zoi Devine B, NP       amLODipine  (NORVASC ) tablet 5 mg  5 mg Oral Daily Laurence Folz B, NP   5 mg at 09/10/24 9251   buPROPion  (WELLBUTRIN  XL) 24 hr tablet 150 mg  150 mg Oral Daily May, Tanya, NP   150 mg at 09/10/24 9251   FLUoxetine  (PROZAC ) capsule 20 mg  20 mg Oral Daily Sruti Ayllon B, NP   20 mg at 09/10/24 0748   folic acid  (FOLVITE ) tablet 1 mg  1 mg Oral Daily Valeska Haislip B, NP   1 mg at 09/10/24 9251   magnesium  hydroxide (MILK OF MAGNESIA) suspension 30 mL  30 mL Oral Daily PRN Donnetta Gillin B, NP       multivitamin with minerals tablet 1 tablet  1 tablet Oral Daily Raquan Iannone B, NP   1 tablet at 09/10/24 0748   OLANZapine  (ZYPREXA ) injection 5 mg  5 mg  Intramuscular TID PRN Amirr Achord B, NP       OLANZapine  (ZYPREXA ) tablet 5 mg  5 mg Oral TID PRN Zaryia Markel B, NP   5 mg at 09/10/24 0750   pneumococcal 20-valent conjugate vaccine (PREVNAR 20 ) injection 0.5 mL  0.5 mL Intramuscular Tomorrow-1000 Jadapalle, Sree, MD       QUEtiapine  (SEROQUEL ) tablet 25 mg  25 mg Oral QHS [place] Virrueta B, NP       thiamine  (VITAMIN B1) tablet 100 mg  100 mg Oral Daily Jolean Madariaga B, NP   100 mg at 09/10/24 9251   Or   thiamine  (VITAMIN B1) injection 100 mg  100 mg Intravenous Daily Megyn Leng B, NP        Lab Results:  No results found for this or any previous visit (from the past 48 hours).   Blood Alcohol level:  Lab Results  Component Value Date   ETH 147 (H) 09/05/2024    Metabolic Disorder Labs: Lab Results  Component Value Date   HGBA1C 5.3 09/08/2024   MPG 105.41 09/08/2024   No results found for: PROLACTIN Lab Results  Component Value Date   CHOL 138 09/08/2024   TRIG 85 09/08/2024   HDL 32 (L) 09/08/2024   CHOLHDL 4.4  09/08/2024   VLDL 17 09/08/2024   LDLCALC 89 09/08/2024    Physical Findings: AIMS:  , ,  ,  ,    CIWA:  CIWA-Ar Total: 1 COWS:      Psychiatric Specialty Exam:  Presentation  General Appearance:  Disheveled  Eye Contact: Minimal  Speech: Clear and Coherent  Speech Volume: Decreased    Mood and Affect  Mood: Dysphoric; Depressed  Affect: Depressed   Thought Process  Thought Processes: Coherent  Orientation:Full (Time, Place and Person)  Thought Content:Logical  Hallucinations:No data recorded  Ideas of Reference:None  Suicidal Thoughts:No data recorded  Homicidal Thoughts:No data recorded   Sensorium  Memory: Immediate Fair; Recent Fair  Judgment: Poor  Insight: Fair   Chartered Certified Accountant: Fair  Attention Span: Fair  Recall: [employer] of Knowledge: Fair  Language: Fair   Psychomotor Activity  Psychomotor Activity: No data recorded  Musculoskeletal: Strength & Muscle Tone: within normal limits Gait & Station: normal Assets  Assets: Housing; Vocational/Educational    Physical Exam: Physical Exam Pulmonary:     Effort: Pulmonary effort is normal.  Neurological:     Mental Status: He is alert and oriented to person, place, and time.    Review of Systems  Respiratory:  Negative for shortness of breath.   Cardiovascular:  Negative for chest pain.  Gastrointestinal:  Negative for nausea and vomiting.  Neurological:  Negative for dizziness, loss of consciousness and headaches.  Psychiatric/Behavioral:  Positive for depression. Negative for hallucinations and suicidal ideas.    Blood pressure (!) 144/93, pulse 65, temperature 97.8 F (36.6 C), temperature source Oral, resp. rate 18, SpO2 97%. There is no height or weight on file to calculate BMI.  Diagnosis: Principal Problem:   MDD (major depressive disorder)   PLAN: Safety and Monitoring:  -- Voluntary admission to inpatient psychiatric unit  for safety, stabilization and treatment  -- Daily contact with patient to assess and evaluate symptoms and progress in treatment  -- Patient's case to be discussed in multi-disciplinary team meeting  -- Observation Level : q15 minute checks  -- Vital signs:  q12 hours  -- Precautions: suicide, elopement, and assault -- Encouraged patient to participate in unit  milieu and in scheduled group therapies  2. Psychiatric Treatment:  Scheduled Medications: -Continue previously prescribed Prozac  20 mg daily and Wellbutrin  150 mg extended release daily.   *Will be discontinuing Abilify  and switching to Seroquel  25 mg nightly starting tonight 09/10/2024 due to patient already taking yesterdays dose of Abilify .  Seroquel  being added as adjunct of treatment to help target patient's continued depressive symptoms as well as insomnia.   -- The risks/benefits/side-effects/alternatives to this medication were discussed in detail with the patient and time was given for questions. The patient consents to medication trial.  3. Medical Issues Being Addressed:  - Restarted patient's previously prescribed amlodipine  at 5 mg daily to target elevated blood pressure readings and patient's noted history of hypertension - Patient did have noted elevated blood pressure reading this morning, we will plan to increase patient's amlodipine  to 10 mg his normal daily if continued elevated readings.  Patient denied any associated symptoms including dizziness, headaches, chest pain, shortness of breath or LOC.  We will continue to monitor    4. Discharge Planning:   -- Social work and case management to assist with discharge planning and identification of hospital follow-up needs prior to discharge  -- Estimated LOS: 3-4 days Zelda Sharps, NP This note was created using [employer]. Please excuse any inadvertent transcription errors. Case was discussed with supervising physician Dr. Ruther who is agreeable with  current plan.

## 2024-09-11 MED ORDER — AMLODIPINE BESYLATE 5 MG PO TABS
10.0000 mg | ORAL_TABLET | Freq: Every day | ORAL | Status: DC
  Administered 2024-09-12 – 2024-09-14 (×3): 10 mg via ORAL
  Filled 2024-09-11 (×3): qty 2

## 2024-09-11 NOTE — Group Note (Signed)
 Date:  09/11/2024 Time:  8:51 PM  Group Topic/Focus:  Managing Feelings:   The focus of this group is to identify what feelings patients have difficulty handling and develop a plan to handle them in a healthier way upon discharge.    Participation Level:  Active  Participation Quality:  Appropriate and Attentive  Affect:  Appropriate  Cognitive:  Alert and Appropriate  Insight: Appropriate  Engagement in Group:  Engaged  Modes of Intervention:  Discussion and Support  Additional Comments:    Ginny JONETTA Galeazzi 09/11/2024, 8:51 PM

## 2024-09-11 NOTE — Group Note (Signed)
 Recreation Therapy Group Note   Group Topic:Coping Skills  Group Date: 09/11/2024 Start Time: 1530 End Time: 1615 Facilitators: Celestia Jeoffrey FORBES ARTICE, CTRS Location: Craft Room  Group Description: Coping A-Z. LRT and patients engage in a guided discussion on what coping skills are and gave specific examples. LRT passed out a handout labeled Coping A-Z with blank spaces beside each letter. LRT prompted patients to come up with a coping skill for each of the letters. LRT and patients went over the handout and gave ideas for each letter if anyone had any blanks left on their paper. Patients kept this handout with them that listed 26 different coping skills.   Goal Area(s) Addressed: Patients will be able to define coping skills. Patient will identify new coping skills.  Patient will increase communication.   Affect/Mood: Appropriate and Flat   Participation Level: Moderate   Participation Quality: Independent   Behavior: Calm and Cooperative   Speech/Thought Process: Coherent   Insight: Fair   Judgement: Fair    Modes of Intervention: Education, Worksheet, and Writing   Patient Response to Interventions:  Receptive   Education Outcome:  In group clarification offered    Clinical Observations/Individualized Feedback: Nicholas Durham was somewhat active in their participation of session activities and group discussion. Pt interacted well with LRT and peers duration of session.    Plan: Continue to engage patient in RT group sessions 2-3x/week.   Jeoffrey FORBES Celestia, LRT, CTRS 09/11/2024 5:13 PM

## 2024-09-11 NOTE — Group Note (Signed)
 Recreation Therapy Group Note   Group Topic:Healthy Support Systems  Group Date: 09/11/2024 Start Time: 1020 End Time: 1115 Facilitators: Celestia Jeoffrey FORBES ARTICE, CTRS Location: Craft Room  Group Description: Straw Bridge. In groups or individually, patients were given 10 plastic drinking straws and an equal length of masking tape. Using the materials provided, patients were instructed to build a free-standing bridge-like structure to suspend an everyday item (ex: deck of cards) off the floor or table surface. All materials were required to be used in secondary school teacher. LRT facilitated post-activity discussion reviewing the importance of having strong and healthy support systems in our lives. LRT discussed how the people in our lives serve as the tape and the deck of cards we placed on top of our straw structure are the stressors we face in daily life. LRT and pts discussed what happens in our life when things get too heavy for us , and we don't have strong supports outside of the hospital. Pt shared 2 of their healthy supports in their life aloud in the group.   Goal Area(s) Addressed:  Patient will identify 2 healthy supports in their life. Patient will identify skills to successfully complete activity. Patient will identify correlation of this activity to life post-discharge.  Patient will build on frustration tolerance skills. Patient will increase team building and communication skills.    Affect/Mood: Flat   Participation Level: Minimal    Clinical Observations/Individualized Feedback: Garrel was present in group. Pt was not active in participation. Pt shared that his supports are his father and dog.   Plan: Continue to engage patient in RT group sessions 2-3x/week.   Jeoffrey FORBES Celestia, LRT, CTRS 09/11/2024 11:52 AM

## 2024-09-11 NOTE — Progress Notes (Signed)
 [facility] MD Progress Note  09/11/2024 3:08 PM Nicholas Durham  MRN:  [id number]   Subjective:  Chart reviewed, case discussed in multidisciplinary meeting, patient seen during rounds.   Patient seen on rounds today on inpatient psychiatric unit. Denies current suicidal ideations, homicidal ideations, auditory hallucinations, and visual hallucinations. Reports doing overall okay. Recently initiated on Abilify  to augment existing regimen of Prozac  and Wellbutrin . Patient reports tolerating medication well without noticeable side effects at this time. Objectively, no evidence of medication-related side effects noted on examination including absence of extrapyramidal symptoms, akathisia, or other movement disorders. Patient reports he was previously prescribed amlodipine  for blood pressure control but has been without this medication for some time. Review of flowsheets reveals elevated blood pressure readings while on unit including 129/90, 148/86, and 131/89 among others, indicating inadequate blood pressure control. This provider contacted [facility] which confirmed patient was previously prescribed amlodipine  10 mg daily with last fill date of September 2025 for 90-day supply. Given elevated blood pressure readings and previous prescription history, amlodipine  will be reinitiated at 5 mg daily with plan to titrate further as needed based on blood pressure trends and clinical response. Patient denies associated symptoms including chest pain, shortness of breath, headaches, dizziness, or syncopal episodes. Patient continues to demonstrate depressed affect but remains cooperative with psychiatry assessment and treatment plan.  09/09/2024: Patient seen on rounds today by psychiatry.  Patient reported mood was about the same.  He reported he feels as he always feels which is down.  Patient denied current suicidal or homicidal ideations as well as auditory visual hallucinations but continued to  have depressed affect.  Patient denied any noted side effects from current medication regimen including restlessness, headache, nausea vomiting, dizziness, excessive sedation or stomach discomfort.  Patient endorsed poor sleep however he reported he only gets about 4 to 5 hours nightly.  He reported his appetite is okay.  Patient reported previously had been prescribed Seroquel  for adjunct of treatment of depression as well as to help with sleep.  We discussed the risk, benefits, side effects of switching Abilify  to the Seroquel  to which patient verbalized understanding.  Patient voiced preference and gave informed consent to make the above changes.  Since patient had already taken his Abilify  dose today we will discontinue the Abilify  and begin Seroquel  dosing tomorrow night.  On today's exam, objectively there was no evidence of mania, psychosis, or patient responding to internal stimuli currently.  09/10/2024: On rounds today, patient expressed desire to go home.  However we discussed the need for further monitoring due to recent medication changes as listed above.  Patient verbalized understanding.  Today will be the first day that patient will take Seroquel  at night to target mood, ongoing depression and insomnia.  Per review of chart, it appears patient was given 5 mg Zyprexa  this morning.  Nursing staff reported this was due to anxiety/agitation.  On today's exam, patient denied suicidal or homicidal ideations, as well as auditory or visual hallucinations.  09/11/2024: Patient seen on rounds today by psychiatry.  Patient continued to have flattened, depressed affect, however of note patient reported he was not a bubbly person at baseline.  Patient denied current suicidal or homicidal ideations.  Patient denied auditory or visual hallucinations as well.  Patient reported improvement in sleep last night after taking the Seroquel .  Patient denied any noted side effects from current medication changes.   Objectively, there was no evidence of mania, psychosis, or patient responding to internal stimuli.  Patient continued to voice desire to discharge home.  Patient denied that he had any access to weapons at home.    Past Psychiatric History: see h&P Family History: History reviewed. No pertinent family history. Social History:  Social History   Substance and Sexual Activity  Alcohol Use Yes   Comment: Stopped drinking 01/12/19     Social History   Substance and Sexual Activity  Drug Use Yes   Types: Marijuana    Social History   Socioeconomic History   Marital status: Unknown    Spouse name: Not on file   Number of children: Not on file   Years of education: Not on file   Highest education level: Not on file  Occupational History   Not on file  Tobacco Use   Smoking status: Every Day    Current packs/day: 0.50    Types: Cigarettes   Smokeless tobacco: Never  Substance and Sexual Activity   Alcohol use: Yes    Comment: Stopped drinking 01/12/19   Drug use: Yes    Types: Marijuana   Sexual activity: Not on file  Other Topics Concern   Not on file  Social History Narrative   Not on file   Social Drivers of Health   Tobacco Use: High Risk (09/06/2024)   Patient History    Smoking Tobacco Use: Every Day    Smokeless Tobacco Use: Never    Passive Exposure: Not on file  Financial Resource Strain: High Risk (08/12/2022)   Received from [facility]   Overall Financial Resource Strain (CARDIA)    Difficulty of Paying Living Expenses: Very hard  Food Insecurity: No Food Insecurity (09/06/2024)   Epic    Worried About Radiation Protection Practitioner of Food in the Last Year: Never true    Ran Out of Food in the Last Year: Never true  Transportation Needs: No Transportation Needs (09/06/2024)   Epic    Lack of Transportation (Medical): No    Lack of Transportation (Non-Medical): No  Physical Activity: Not on file  Stress: Not on file  Social Connections: Not on file  Depression  (EYV7-0): Not on file  Alcohol Screen: Medium Risk (09/06/2024)   Alcohol Screen    Last Alcohol Screening Score (AUDIT): 14  Housing: Low Risk (09/06/2024)   Epic    Unable to Pay for Housing in the Last Year: No    Number of Times Moved in the Last Year: 0    Homeless in the Last Year: No  Utilities: Not At Risk (09/06/2024)   Epic    Threatened with loss of utilities: No  Health Literacy: Not on file   Past Medical History:  Past Medical History:  Diagnosis Date   ETOH abuse     Past Surgical History:  Procedure Laterality Date   APPENDECTOMY      Current Medications: Current Facility-Administered Medications  Medication Dose Route Frequency Provider Last Rate Last Admin   acetaminophen  (TYLENOL ) tablet 650 mg  650 mg Oral Q6H PRN Andie Mungin B, NP   650 mg at 09/11/24 1315   alum & mag hydroxide-simeth (MAALOX/MYLANTA) 200-200-20 MG/5ML suspension 30 mL  30 mL Oral Q4H PRN Janecia Palau B, NP       amLODipine  (NORVASC ) tablet 5 mg  5 mg Oral Daily Toula Miyasaki B, NP   5 mg at 09/11/24 9180   buPROPion  (WELLBUTRIN  XL) 24 hr tablet 150 mg  150 mg Oral Daily May, Tanya, NP   150 mg at 09/11/24 [phone]  FLUoxetine  (PROZAC ) capsule 20 mg  20 mg Oral Daily Flois Mctague B, NP   20 mg at 09/11/24 9180   folic acid  (FOLVITE ) tablet 1 mg  1 mg Oral Daily Kamera Dubas B, NP   1 mg at 09/11/24 9180   magnesium  hydroxide (MILK OF MAGNESIA) suspension 30 mL  30 mL Oral Daily PRN Joushua Dugar B, NP       multivitamin with minerals tablet 1 tablet  1 tablet Oral Daily Assunta Pupo B, NP   1 tablet at 09/11/24 9180   OLANZapine  (ZYPREXA ) injection 5 mg  5 mg Intramuscular TID PRN Rosamae Rocque B, NP       OLANZapine  (ZYPREXA ) tablet 5 mg  5 mg Oral TID PRN Kathern Lobosco B, NP   5 mg at 09/10/24 0750   pneumococcal 20-valent conjugate vaccine (PREVNAR 20 ) injection 0.5 mL  0.5 mL Intramuscular Tomorrow-1000 Jadapalle, Sree, MD       QUEtiapine  (SEROQUEL ) tablet 25 mg  25 mg Oral QHS Cletis Muma B,  NP   25 mg at 09/10/24 2114   thiamine  (VITAMIN B1) tablet 100 mg  100 mg Oral Daily Luismanuel Corman B, NP   100 mg at 09/11/24 9180   Or   thiamine  (VITAMIN B1) injection 100 mg  100 mg Intravenous Daily Aarna Mihalko B, NP        Lab Results:  No results found for this or any previous visit (from the past 48 hours).   Blood Alcohol level:  Lab Results  Component Value Date   ETH 147 (H) 09/05/2024    Metabolic Disorder Labs: Lab Results  Component Value Date   HGBA1C 5.3 09/08/2024   MPG 105.41 09/08/2024   No results found for: PROLACTIN Lab Results  Component Value Date   CHOL 138 09/08/2024   TRIG 85 09/08/2024   HDL 32 (L) 09/08/2024   CHOLHDL 4.4 09/08/2024   VLDL 17 09/08/2024   LDLCALC 89 09/08/2024    Physical Findings: AIMS:  , ,  ,  ,    CIWA:  CIWA-Ar Total: 2 COWS:      Psychiatric Specialty Exam:  Presentation  General Appearance:  Disheveled  Eye Contact: Minimal  Speech: Clear and Coherent  Speech Volume: Decreased    Mood and Affect  Mood: Dysphoric; Depressed  Affect: Depressed   Thought Process  Thought Processes: Coherent  Orientation:Full (Time, Place and Person)  Thought Content:Logical  Hallucinations:No data recorded  Ideas of Reference:None  Suicidal Thoughts:No data recorded  Homicidal Thoughts:No data recorded   Sensorium  Memory: Immediate Fair; Recent Fair  Judgment: Poor  Insight: Fair   Chartered Certified Accountant: Fair  Attention Span: Fair  Recall: [employer] of Knowledge: Fair  Language: Fair   Psychomotor Activity  Psychomotor Activity: No data recorded  Musculoskeletal: Strength & Muscle Tone: within normal limits Gait & Station: normal Assets  Assets: Housing; Vocational/Educational    Physical Exam: Physical Exam Pulmonary:     Effort: Pulmonary effort is normal.  Neurological:     Mental Status: He is alert and oriented to person, place, and time.     Review of Systems  Respiratory:  Negative for shortness of breath.   Cardiovascular:  Negative for chest pain.  Gastrointestinal:  Negative for nausea and vomiting.  Neurological:  Negative for dizziness, loss of consciousness and headaches.  Psychiatric/Behavioral:  Positive for depression. Negative for hallucinations and suicidal ideas.    Blood pressure (!) 138/118, pulse 62, temperature 97.8  F (36.6 C), temperature source Oral, resp. rate 18, SpO2 97%. There is no height or weight on file to calculate BMI.  Diagnosis: Principal Problem:   MDD (major depressive disorder)   PLAN: Safety and Monitoring:  -- Voluntary admission to inpatient psychiatric unit for safety, stabilization and treatment  -- Daily contact with patient to assess and evaluate symptoms and progress in treatment  -- Patient's case to be discussed in multi-disciplinary team meeting  -- Observation Level : q15 minute checks  -- Vital signs:  q12 hours  -- Precautions: suicide, elopement, and assault -- Encouraged patient to participate in unit milieu and in scheduled group therapies  2. Psychiatric Treatment:  Scheduled Medications: -Continue previously prescribed Prozac  20 mg daily and Wellbutrin  150 mg extended release daily.   - Continue Seroquel  25 mg nightly.  Seroquel  being added as adjunct of treatment to help target patient's continued depressive symptoms as well as insomnia.   -- The risks/benefits/side-effects/alternatives to this medication were discussed in detail with the patient and time was given for questions. The patient consents to medication trial.  3. Medical Issues Being Addressed:  - Restarted patient's previously prescribed amlodipine  at 5 mg daily to target elevated blood pressure readings and patient's noted history of hypertension - Patient did have noted elevated blood pressure reading this morning, we will plan to increase patient's amlodipine  to 10 mg his normal daily if  continued elevated readings.  Patient denied any associated symptoms including dizziness, headaches, chest pain, shortness of breath or LOC.  We will continue to monitor    4. Discharge Planning:   -- Social work and case management to assist with discharge planning and identification of hospital follow-up needs prior to discharge  -- Estimated LOS: 3-4 days Zelda Sharps, NP This note was created using [employer]. Please excuse any inadvertent transcription errors. Case was discussed with supervising physician Dr. Jadapalle who is agreeable with current plan.

## 2024-09-11 NOTE — Progress Notes (Signed)
" °   09/11/24 0900  Psych Admission Type (Psych Patients Only)  Admission Status Involuntary  Psychosocial Assessment  Patient Complaints Anxiety  Eye Contact Fair  Facial Expression Flat  Affect Appropriate to circumstance  Speech Logical/coherent  Interaction Minimal  Motor Activity Slow  Appearance/Hygiene In scrubs;Disheveled  Behavior Characteristics Appropriate to situation;Cooperative  Mood Sad  Aggressive Behavior  Effect No apparent injury  Thought Process  Coherency WDL  Content WDL  Delusions None reported or observed  Perception WDL  Hallucination None reported or observed  Judgment WDL  Confusion WDL  Danger to Self  Current suicidal ideation? Denies  Agreement Not to Harm Self Yes  Description of Agreement Verbal  Danger to Others  Danger to Others None reported or observed    "

## 2024-09-11 NOTE — Group Note (Signed)
 [facility] LCSW Group Therapy Note   Group Date: 09/11/2024 Start Time: 1245 End Time: 1357   Type of Therapy/Topic:  Group Therapy:  Emotion Regulation  Participation Level:  Active   Mood:  Description of Group:    The purpose of this group is to assist patients in learning to regulate negative emotions and experience positive emotions. Patients will be guided to discuss ways in which they have been vulnerable to their negative emotions. These vulnerabilities will be juxtaposed with experiences of positive emotions or situations, and patients challenged to use positive emotions to combat negative ones. Special emphasis will be placed on coping with negative emotions in conflict situations, and patients will process healthy conflict resolution skills.  Therapeutic Goals: Patient will identify two positive emotions or experiences to reflect on in order to balance out negative emotions:  Patient will label two or more emotions that they find the most difficult to experience:  Patient will be able to demonstrate positive conflict resolution skills through discussion or role plays:   Summary of Patient Progress:  During group, patient and group explored the ways in which our thoughts can impact our feelings which impacts our behaviors. Group along with facilitator completed a thermometer activity where different areas of life were explored. Participants were asked to notate in which zone these areas exist in on their personal thermometers. The group then discussed coping skills, and safety plans to help better prepare for potential stressors and learn to better emotionally regulate.      Therapeutic Modalities:   Cognitive Behavioral Therapy Feelings Identification Dialectical Behavioral Therapy   Nicholas CHRISTELLA Kerns, LCSW

## 2024-09-11 NOTE — Group Note (Signed)
 Date:  09/11/2024 Time:  10:53 AM  Group Topic/Focus:  Managing Feelings:   The focus of this group is to identify what feelings patients have difficulty handling and develop a plan to handle them in a healthier way upon discharge.    Participation Level:  Active  Participation Quality:  Appropriate  Affect:  Appropriate  Cognitive:  Appropriate  Insight: Appropriate  Engagement in Group:  Engaged  Modes of Intervention:  Activity  Additional Comments:    Osborne Serio 09/11/2024, 10:53 AM

## 2024-09-12 MED ORDER — QUETIAPINE FUMARATE 25 MG PO TABS
50.0000 mg | ORAL_TABLET | Freq: Every day | ORAL | Status: DC
  Administered 2024-09-12 – 2024-09-13 (×2): 50 mg via ORAL
  Filled 2024-09-12 (×2): qty 2

## 2024-09-12 NOTE — Group Note (Signed)
 Date:  09/12/2024 Time:  5:04 PM  Group Topic/Focus:  Building Self Esteem:   The Focus of this group is helping patients become aware of the effects of self-esteem on their lives, the things they and others do that enhance or undermine their self-esteem, seeing the relationship between their level of self-esteem and the choices they make and learning ways to enhance self-esteem.    Participation Level:  Active  Participation Quality:  Appropriate  Affect:  Appropriate  Cognitive:  Appropriate  Insight: Appropriate  Engagement in Group:  Engaged  Modes of Intervention:  Activity  Additional Comments:    Nicholas Durham June 09/12/2024, 5:04 PM

## 2024-09-12 NOTE — Group Note (Signed)
 Date:  09/12/2024 Time:  8:46 PM  Group Topic/Focus:  Orientation:   The focus of this group is to educate the patient on the purpose and policies of crisis stabilization and provide a format to answer questions about their admission.  The group details unit policies and expectations of patients while admitted. Wrap-Up Group:   The focus of this group is to help patients review their daily goal of treatment and discuss progress on daily workbooks.    Participation Level:  Active  Participation Quality:  Appropriate and Attentive  Affect:  Appropriate  Cognitive:  Alert and Appropriate  Insight: Appropriate and Good  Engagement in Group:  Engaged  Modes of Intervention:  Orientation  Additional Comments:     Arlester CHRISTELLA Servant 09/12/2024, 8:46 PM

## 2024-09-12 NOTE — Group Note (Signed)
 Recreation Therapy Group Note   Group Topic:Stress Management  Group Date: 09/12/2024 Start Time: 1530 End Time: 1615 Facilitators: Celestia Jeoffrey BRAVO, LRT, CTRS Location: Dayroom  Group Description: UNO. LRT and pts played games of UNO. LRT prompted group discussion on the physical signs and symptoms of stress, like those you feel when playing a competitive card game. LRT and pt discussed the physical and mental signs of stress, as well as coping skills to manage them.   Goal Area(s) Addressed: Patient will identify physical symptoms of stress. Patient will identify coping skills for stress. Patient will build frustration tolerance skills.  Patient will increase communication.    Affect/Mood: N/A   Participation Level: Did not attend    Clinical Observations/Individualized Feedback: Patient did not attend.  Plan: Continue to engage patient in RT group sessions 2-3x/week.   Jeoffrey BRAVO Celestia, LRT, CTRS 09/12/2024 5:10 PM

## 2024-09-12 NOTE — Group Note (Signed)
 [facility] LCSW Group Therapy Note   Group Date: 09/12/2024 Start Time: 1335 End Time: 1415  Type of Therapy/Topic:  Group Therapy:  Feelings about Diagnosis  Participation Level:  None   Description of Group:    This group will allow patients to explore their thoughts and feelings about diagnoses they have received. Patients will be guided to explore their level of understanding and acceptance of these diagnoses. Facilitator will encourage patients to process their thoughts and feelings about the reactions of others to their diagnosis, and will guide patients in identifying ways to discuss their diagnosis with significant others in their lives. This group will be process-oriented, with patients participating in exploration of their own experiences as well as giving and receiving support and challenge from other group members.   Therapeutic Goals: 1. Patient will demonstrate understanding of diagnosis as evidence by identifying two or more symptoms of the disorder:  2. Patient will be able to express two feelings regarding the diagnosis 3. Patient will demonstrate ability to communicate their needs through discussion and/or role plays  Summary of Patient Progress: Patient attended group, however, declined to participate in group discussions.     Therapeutic Modalities:   Cognitive Behavioral Therapy Brief Therapy Feelings Identification    Sherryle JINNY Margo, LCSW

## 2024-09-12 NOTE — Progress Notes (Signed)
 Pt calm and pleasant during assessment denying SI/HI/AVH. Pt observed by this Clinical research associate interacting appropriately with staff and peers on the unit. Pt compliant with medication administration per MD orders. Pt given education, support, and encouragement to be active in his treatment plan. Pt being monitored Q 15 minutes for safety per unit protocol, remains safe on the unit

## 2024-09-12 NOTE — Group Note (Signed)
 Recreation Therapy Group Note   Group Topic:Animal Assisted Therapy   Group Date: 09/12/2024 Start Time: 1000 End Time: 1030 Facilitators: Celestia Jeoffrey BRAVO, LRT, CTRS Location: Dayroom  Group Description: AAA. Animal-Assisted Activity provides opportunities for motivational, educational, therapeutic and/or recreational benefits to enhance quality of life. Selinda and Rollo visited the unit to interact with patients.   Goal Areas Addressed:  Reduced anxiety and stress Improved mood Increased social interaction Enhanced communication skills Reduced loneliness and isolation Improved emotional regulation   Affect/Mood: Appropriate and Flat   Participation Level: Minimal   Participation Quality: Independent   Behavior: Calm   Speech/Thought Process: Coherent   Insight: Fair   Judgement: Fair    Modes of Intervention: Activity   Patient Response to Interventions:  Receptive   Education Outcome:  In group clarification offered    Clinical Observations/Individualized Feedback: Nicholas Durham was somewhat active in their participation of session activities and group discussion. Pt interacted well with LRT and peers duration of session.    Plan: Continue to engage patient in RT group sessions 2-3x/week.   Jeoffrey BRAVO Celestia, LRT, CTRS 09/12/2024 11:24 AM

## 2024-09-12 NOTE — [facility] Suicide Risk Assessment (Signed)
 [facility] INPATIENT:  Family/Significant Other Suicide Prevention Education  Suicide Prevention Education:  Education Completed; Rosalynn Mires/father [phone]), has been identified by the patient as the family member/significant other with whom the patient will be residing, and identified as the person(s) who will aid the patient in the event of a mental health crisis (suicidal ideations/suicide attempt).  With written consent from the patient, the family member/significant other has been provided the following suicide prevention education, prior to the and/or following the discharge of the patient.  The suicide prevention education provided includes the following: Suicide risk factors Suicide prevention and interventions National Suicide Hotline telephone number [facility] assessment telephone number [facility] Emergency Assistance [address] [facility] and/or Residential Mobile Crisis Unit telephone number  Request made of family/significant other to: Remove weapons (e.g., guns, rifles, knives), all items previously/currently identified as safety concern.   Remove drugs/medications (over-the-counter, prescriptions, illicit drugs), all items previously/currently identified as a safety concern.  The family member/significant other verbalizes understanding of the suicide prevention education information provided.  The family member/significant other agrees to remove the items of safety concern listed above.  Father denied feeling that patient is a danger to himself or anyone else. He denied patient having access to any weapons in the home. [place] inquired when would the pt be discharged. He was informed that team was not certain at the moment but hopefully in the next few days. No other concerns expressed. Contact ended without incident.   Nadara JONELLE Fam 09/12/2024, 4:26 PM

## 2024-09-12 NOTE — Progress Notes (Signed)
 [facility] MD Progress Note  09/12/2024 3:34 PM Nicholas Durham  MRN:  [id number]   Subjective:  Chart reviewed, case discussed in multidisciplinary meeting, patient seen during rounds.   Patient seen on rounds today on inpatient psychiatric unit. Denies current suicidal ideations, homicidal ideations, auditory hallucinations, and visual hallucinations. Reports doing overall okay. Recently initiated on Abilify  to augment existing regimen of Prozac  and Wellbutrin . Patient reports tolerating medication well without noticeable side effects at this time. Objectively, no evidence of medication-related side effects noted on examination including absence of extrapyramidal symptoms, akathisia, or other movement disorders. Patient reports he was previously prescribed amlodipine  for blood pressure control but has been without this medication for some time. Review of flowsheets reveals elevated blood pressure readings while on unit including 129/90, 148/86, and 131/89 among others, indicating inadequate blood pressure control. This provider contacted [facility] which confirmed patient was previously prescribed amlodipine  10 mg daily with last fill date of September 2025 for 90-day supply. Given elevated blood pressure readings and previous prescription history, amlodipine  will be reinitiated at 5 mg daily with plan to titrate further as needed based on blood pressure trends and clinical response. Patient denies associated symptoms including chest pain, shortness of breath, headaches, dizziness, or syncopal episodes. Patient continues to demonstrate depressed affect but remains cooperative with psychiatry assessment and treatment plan.  09/09/2024: Patient seen on rounds today by psychiatry.  Patient reported mood was about the same.  He reported he feels as he always feels which is down.  Patient denied current suicidal or homicidal ideations as well as auditory visual hallucinations but continued to  have depressed affect.  Patient denied any noted side effects from current medication regimen including restlessness, headache, nausea vomiting, dizziness, excessive sedation or stomach discomfort.  Patient endorsed poor sleep however he reported he only gets about 4 to 5 hours nightly.  He reported his appetite is okay.  Patient reported previously had been prescribed Seroquel  for adjunct of treatment of depression as well as to help with sleep.  We discussed the risk, benefits, side effects of switching Abilify  to the Seroquel  to which patient verbalized understanding.  Patient voiced preference and gave informed consent to make the above changes.  Since patient had already taken his Abilify  dose today we will discontinue the Abilify  and begin Seroquel  dosing tomorrow night.  On today's exam, objectively there was no evidence of mania, psychosis, or patient responding to internal stimuli currently.  09/10/2024: On rounds today, patient expressed desire to go home.  However we discussed the need for further monitoring due to recent medication changes as listed above.  Patient verbalized understanding.  Today will be the first day that patient will take Seroquel  at night to target mood, ongoing depression and insomnia.  Per review of chart, it appears patient was given 5 mg Zyprexa  this morning.  Nursing staff reported this was due to anxiety/agitation.  On today's exam, patient denied suicidal or homicidal ideations, as well as auditory or visual hallucinations.  09/11/2024: Patient seen on rounds today by psychiatry.  Patient continued to have flattened, depressed affect, however of note patient reported he was not a bubbly person at baseline.  Patient denied current suicidal or homicidal ideations.  Patient denied auditory or visual hallucinations as well.  Patient reported improvement in sleep last night after taking the Seroquel .  Patient denied any noted side effects from current medication changes.   Objectively, there was no evidence of mania, psychosis, or patient responding to internal stimuli.  Patient continued to voice desire to discharge home.  Patient denied that he had any access to weapons at home.  09/12/24: Patient found in his room on rounds. He endorses frustration with being in the hospital. He denies SI, HI, and AVH.  He has been medication compliant and denies concerns for side effects/issues. He endorses he is not a social person normally and so has not been very active in the milieu. He identifies wanting to go home to his parents as he helps them around the house. Discussed getting collateral from father and he reports he will call then to tell him to answer when SW calls.    Past Psychiatric History: see h&P Family History: History reviewed. No pertinent family history. Social History:  Social History   Substance and Sexual Activity  Alcohol Use Yes   Comment: Stopped drinking 01/12/19     Social History   Substance and Sexual Activity  Drug Use Yes   Types: Marijuana    Social History   Socioeconomic History   Marital status: Unknown    Spouse name: Not on file   Number of children: Not on file   Years of education: Not on file   Highest education level: Not on file  Occupational History   Not on file  Tobacco Use   Smoking status: Every Day    Average packs/day: 0.5 packs/day for 40.0 years (20.0 ttl pk-yrs)    Types: Cigarettes    Start date: 08/24/1984   Smokeless tobacco: Never  Substance and Sexual Activity   Alcohol use: Yes    Comment: Stopped drinking 01/12/19   Drug use: Yes    Types: Marijuana   Sexual activity: Not on file  Other Topics Concern   Not on file  Social History Narrative   Not on file   Social Drivers of Health   Tobacco Use: High Risk (09/11/2024)   Patient History    Smoking Tobacco Use: Every Day    Smokeless Tobacco Use: Never    Passive Exposure: Not on file  Financial Resource Strain: High Risk (08/12/2022)    Received from [facility]   Overall Financial Resource Strain (CARDIA)    Difficulty of Paying Living Expenses: Very hard  Food Insecurity: No Food Insecurity (09/06/2024)   Epic    Worried About Radiation Protection Practitioner of Food in the Last Year: Never true    Ran Out of Food in the Last Year: Never true  Transportation Needs: No Transportation Needs (09/06/2024)   Epic    Lack of Transportation (Medical): No    Lack of Transportation (Non-Medical): No  Physical Activity: Not on file  Stress: Not on file  Social Connections: Not on file  Depression (EYV7-0): Not on file  Alcohol Screen: Medium Risk (09/06/2024)   Alcohol Screen    Last Alcohol Screening Score (AUDIT): 14  Housing: Low Risk (09/06/2024)   Epic    Unable to Pay for Housing in the Last Year: No    Number of Times Moved in the Last Year: 0    Homeless in the Last Year: No  Utilities: Not At Risk (09/06/2024)   Epic    Threatened with loss of utilities: No  Health Literacy: Not on file   Past Medical History:  Past Medical History:  Diagnosis Date   ETOH abuse     Past Surgical History:  Procedure Laterality Date   APPENDECTOMY      Current Medications: Current Facility-Administered Medications  Medication Dose Route Frequency  Provider Last Rate Last Admin   acetaminophen  (TYLENOL ) tablet 650 mg  650 mg Oral Q6H PRN Smith, Annie B, NP   650 mg at 09/12/24 0754   alum & mag hydroxide-simeth (MAALOX/MYLANTA) 200-200-20 MG/5ML suspension 30 mL  30 mL Oral Q4H PRN Smith, Annie B, NP       amLODipine  (NORVASC ) tablet 10 mg  10 mg Oral Daily Smith, Annie B, NP   10 mg at 09/12/24 9244   buPROPion  (WELLBUTRIN  XL) 24 hr tablet 150 mg  150 mg Oral Daily Joyceann Kruser, NP   150 mg at 09/12/24 9245   FLUoxetine  (PROZAC ) capsule 20 mg  20 mg Oral Daily Smith, Annie B, NP   20 mg at 09/12/24 0754   folic acid  (FOLVITE ) tablet 1 mg  1 mg Oral Daily Smith, Annie B, NP   1 mg at 09/12/24 9245   magnesium  hydroxide (MILK OF MAGNESIA)  suspension 30 mL  30 mL Oral Daily PRN Smith, Annie B, NP       multivitamin with minerals tablet 1 tablet  1 tablet Oral Daily Smith, Annie B, NP   1 tablet at 09/12/24 9245   OLANZapine  (ZYPREXA ) injection 5 mg  5 mg Intramuscular TID PRN Smith, Annie B, NP       OLANZapine  (ZYPREXA ) tablet 5 mg  5 mg Oral TID PRN Smith, Annie B, NP   5 mg at 09/10/24 0750   pneumococcal 20-valent conjugate vaccine (PREVNAR 20 ) injection 0.5 mL  0.5 mL Intramuscular Tomorrow-1000 Donnelly Mellow, MD       QUEtiapine  (SEROQUEL ) tablet 25 mg  25 mg Oral QHS Smith, Annie B, NP   25 mg at 09/11/24 2056   thiamine  (VITAMIN B1) tablet 100 mg  100 mg Oral Daily Smith, Annie B, NP   100 mg at 09/12/24 9244   Or   thiamine  (VITAMIN B1) injection 100 mg  100 mg Intravenous Daily Smith, Annie B, NP        Lab Results:  No results found for this or any previous visit (from the past 48 hours).   Blood Alcohol level:  Lab Results  Component Value Date   ETH 147 (H) 09/05/2024    Metabolic Disorder Labs: Lab Results  Component Value Date   HGBA1C 5.3 09/08/2024   MPG 105.41 09/08/2024   No results found for: PROLACTIN Lab Results  Component Value Date   CHOL 138 09/08/2024   TRIG 85 09/08/2024   HDL 32 (L) 09/08/2024   CHOLHDL 4.4 09/08/2024   VLDL 17 09/08/2024   LDLCALC 89 09/08/2024    Physical Findings: AIMS:  , ,  ,  ,    CIWA:  CIWA-Ar Total: 2 COWS:      Psychiatric Specialty Exam:  Presentation  General Appearance:  Appropriate for Environment  Eye Contact: Fair  Speech: Clear and Coherent; Normal Rate  Speech Volume: Normal    Mood and Affect  Mood: Euthymic  Affect: Appropriate   Thought Process  Thought Processes: Coherent; Linear  Orientation:Full (Time, Place and Person)  Thought Content:Logical  Hallucinations:Hallucinations: None   Ideas of Reference:None  Suicidal Thoughts:Suicidal Thoughts: No   Homicidal Thoughts:Homicidal Thoughts:  No    Sensorium  Memory: Immediate Fair; Recent Fair  Judgment: Fair  Insight: Fair   Art Therapist  Concentration: Fair  Attention Span: Fair  Recall: Fair  Fund of Knowledge: Fair  Language: Good   Psychomotor Activity  Psychomotor Activity: Psychomotor Activity: Normal   Musculoskeletal: Strength & Muscle  Tone: within normal limits Gait & Station: normal Assets  Assets: Housing; Vocational/Educational; Manufacturing Systems Engineer    Physical Exam: Physical Exam Vitals and nursing note reviewed.  Constitutional:      Appearance: Normal appearance.  Pulmonary:     Effort: Pulmonary effort is normal.  Neurological:     Mental Status: He is alert and oriented to person, place, and time.  Psychiatric:        Behavior: Behavior normal.        Thought Content: Thought content normal.    Review of Systems  Respiratory:  Negative for shortness of breath.   Cardiovascular:  Negative for chest pain.  Gastrointestinal:  Positive for diarrhea. Negative for nausea and vomiting.  Neurological:  Negative for dizziness, loss of consciousness and headaches.  Psychiatric/Behavioral:  Positive for depression and substance abuse. Negative for hallucinations and suicidal ideas.   All other systems reviewed and are negative.  Blood pressure (!) 152/97, pulse 84, temperature 98.1 F (36.7 C), temperature source Oral, resp. rate 18, SpO2 98%. There is no height or weight on file to calculate BMI.  Diagnosis: Principal Problem:   MDD (major depressive disorder)   PLAN: Safety and Monitoring:  -- Involuntary admission to inpatient psychiatric unit for safety, stabilization and treatment  -- Daily contact with patient to assess and evaluate symptoms and progress in treatment  -- Patient's case to be discussed in multi-disciplinary team meeting  -- Observation Level : q15 minute checks  -- Vital signs:  q12 hours  -- Precautions: suicide, elopement, and  assault -- Encouraged patient to participate in unit milieu and in scheduled group therapies   2. Psychiatric Treatment:  Scheduled Medications: - Seroquel  50  mg nightly.  Seroquel  being added as adjunct of treatment to help target patient's continued depressive symptoms as well as insomnia. - Wellbutrin  150 ER daily - Fluoxetine  20 mg daily   -- The risks/benefits/side-effects/alternatives to this medication were discussed in detail with the patient and time was given for questions. The patient consents to medication trial.  3. Medical Issues Being Addressed:  - Norvasc  10 mg daily    4. Discharge Planning:   -- Social work and case management to assist with discharge planning and identification of hospital follow-up needs prior to discharge  -- Estimated LOS: 5-7 days  Glenys Boubacar Lerette, NP

## 2024-09-12 NOTE — [facility] Counselor (Signed)
 CSW contacted pt [facility] group leader, Foy [phone]) per request. She informed CSW that she has some concerns about changes in pt cognitive functioning. Foy shared that this is her third time with pt being in her group, this time being a month and a half so far, and she is noticing some cognitive decline in pt. She stated that she does not know what this is but wanted to make the team aware of it. She asked if there is any testing that could be done for it. CSW shared that the provider would be made aware. CSW also explained that there is probably no testing that could be done at the hospital. No other concerns expressed. Contact ended without incident.   CSW notified provider. CSW was informed neurocognitive testing is a outpatient service and that referrals could be made.  CSW attempted contact with [facility] group leader Foy to notify of information. Information that this was not something that could be completed here was left in a HIPAA compliant voicemail.    Nadara SAUNDERS. Chaim, MSW, LCSW, LCAS 09/12/2024 3:46 PM

## 2024-09-12 NOTE — Plan of Care (Signed)
   Problem: Education: Goal: Emotional status will improve Outcome: Progressing Goal: Mental status will improve Outcome: Progressing

## 2024-09-12 NOTE — Plan of Care (Signed)
" °  Problem: Education: Goal: Emotional status will improve Outcome: Progressing Goal: Mental status will improve Outcome: Progressing Goal: Verbalization of understanding the information provided will improve Outcome: Progressing   Problem: Activity: Goal: Interest or engagement in activities will improve Outcome: Progressing   Problem: Coping: Goal: Ability to verbalize frustrations and anger appropriately will improve Outcome: Progressing   Problem: Safety: Goal: Ability to disclose and discuss suicidal ideas will improve Outcome: Progressing   "

## 2024-09-12 NOTE — [facility] Suicide Risk Assessment (Signed)
 [facility] INPATIENT:  Family/Significant Other Suicide Prevention Education  Suicide Prevention Education:  Contact Attempts: Rosalynn Furgason/father [phone]), has been identified by the patient as the family member/significant other with whom the patient will be residing, and identified as the person(s) who will aid the patient in the event of a mental health crisis.  With written consent from the patient, two attempts were made to provide suicide prevention education, prior to and/or following the patient's discharge.  We were unsuccessful in providing suicide prevention education.  A suicide education pamphlet was given to the patient to share with family/significant other.  Date and time of first attempt: 09/12/24 at 8:39 AM Date and time of second attempt: Second attempt needed  CSW was not able to speak to anyone but HIPAA compliant voicemail left with contact information for follow through.  Nicholas Durham Fam 09/12/2024, 8:40 AM

## 2024-09-12 NOTE — Progress Notes (Signed)
" °   09/12/24 0800  Psych Admission Type (Psych Patients Only)  Admission Status Involuntary  Psychosocial Assessment  Patient Complaints Anxiety  Eye Contact Fair  Facial Expression Flat  Affect Appropriate to circumstance  Speech Logical/coherent  Interaction Minimal  Motor Activity Slow  Appearance/Hygiene Disheveled;In scrubs  Behavior Characteristics Appropriate to situation;Calm  Aggressive Behavior  Effect No apparent injury  Thought Process  Coherency WDL  Content WDL  Delusions None reported or observed  Perception WDL  Hallucination None reported or observed  Judgment WDL  Confusion WDL  Danger to Self  Current suicidal ideation? Denies  Agreement Not to Harm Self Yes  Description of Agreement Verbal  Danger to Others  Danger to Others None reported or observed    "

## 2024-09-13 NOTE — Group Note (Signed)
 Date:  09/13/2024 Time:  5:11 PM  Group Topic/Focus:  Building Self Esteem:   The Focus of this group is helping patients become aware of the effects of self-esteem on their lives, the things they and others do that enhance or undermine their self-esteem, seeing the relationship between their level of self-esteem and the choices they make and learning ways to enhance self-esteem.    Participation Level:  Did Not Attend  Jon DELENA Genre 09/13/2024, 5:11 PM

## 2024-09-13 NOTE — Plan of Care (Signed)
  Problem: Education: Goal: Knowledge of [facility] Education information/materials will improve Outcome: Progressing Goal: Emotional status will improve Outcome: Progressing Goal: Mental status will improve Outcome: Progressing Goal: Verbalization of understanding the information provided will improve Outcome: Progressing   Problem: Activity: Goal: Interest or engagement in activities will improve Outcome: Progressing Goal: Sleeping patterns will improve Outcome: Progressing   Problem: Coping: Goal: Ability to verbalize frustrations and anger appropriately will improve Outcome: Progressing Goal: Ability to demonstrate self-control will improve Outcome: Progressing   Problem: Health Behavior/Discharge Planning: Goal: Identification of resources available to assist in meeting health care needs will improve Outcome: Progressing Goal: Compliance with treatment plan for underlying cause of condition will improve Outcome: Progressing   Problem: Physical Regulation: Goal: Ability to maintain clinical measurements within normal limits will improve Outcome: Progressing   Problem: Safety: Goal: Periods of time without injury will increase Outcome: Progressing   Problem: Education: Goal: Utilization of techniques to improve thought processes will improve Outcome: Progressing Goal: Knowledge of the prescribed therapeutic regimen will improve Outcome: Progressing   Problem: Activity: Goal: Interest or engagement in leisure activities will improve Outcome: Progressing Goal: Imbalance in normal sleep/wake cycle will improve Outcome: Progressing   Problem: Coping: Goal: Coping ability will improve Outcome: Progressing Goal: Will verbalize feelings Outcome: Progressing   Problem: Health Behavior/Discharge Planning: Goal: Ability to make decisions will improve Outcome: Progressing Goal: Compliance with therapeutic regimen will improve Outcome: Progressing    Problem: Role Relationship: Goal: Will demonstrate positive changes in social behaviors and relationships Outcome: Progressing   Problem: Safety: Goal: Ability to disclose and discuss suicidal ideas will improve Outcome: Progressing Goal: Ability to identify and utilize support systems that promote safety will improve Outcome: Progressing   Problem: Self-Concept: Goal: Will verbalize positive feelings about self Outcome: Progressing Goal: Level of anxiety will decrease Outcome: Progressing   Problem: Education: Goal: Knowledge of disease or condition will improve Outcome: Progressing Goal: Understanding of discharge needs will improve Outcome: Progressing   Problem: Health Behavior/Discharge Planning: Goal: Ability to identify changes in lifestyle to reduce recurrence of condition will improve Outcome: Progressing Goal: Identification of resources available to assist in meeting health care needs will improve Outcome: Progressing   Problem: Physical Regulation: Goal: Complications related to the disease process, condition or treatment will be avoided or minimized Outcome: Progressing   Problem: Safety: Goal: Ability to remain free from injury will improve Outcome: Progressing   

## 2024-09-13 NOTE — Progress Notes (Signed)
" °   09/13/24 2100  Psychosocial Assessment  Patient Complaints Anxiety  Eye Contact Fair  Facial Expression Flat  Affect Appropriate to circumstance  Speech Logical/coherent  Interaction Minimal  Motor Activity Slow  Appearance/Hygiene In scrubs  Behavior Characteristics Anxious  Mood Sad  Aggressive Behavior  Effect No apparent injury  Thought Process  Coherency WDL  Content WDL  Delusions None reported or observed  Perception WDL  Hallucination None reported or observed  Judgment WDL  Confusion WDL  Danger to Self  Current suicidal ideation? Denies  Agreement Not to Harm Self Yes  Description of Agreement verbal    "

## 2024-09-13 NOTE — Plan of Care (Signed)
  Problem: Education: Goal: Knowledge of  General Education information/materials will improve Outcome: Progressing   Problem: Education: Goal: Emotional status will improve Outcome: Progressing   Problem: Education: Goal: Verbalization of understanding the information provided will improve Outcome: Progressing   Problem: Coping: Goal: Ability to verbalize frustrations and anger appropriately will improve Outcome: Progressing   Problem: Coping: Goal: Ability to demonstrate self-control will improve Outcome: Progressing

## 2024-09-13 NOTE — Progress Notes (Signed)
 [facility] MD Progress Note  09/13/2024 4:41 PM Nicholas Durham  MRN:  [id number]   Subjective:  Chart reviewed, case discussed in multidisciplinary meeting, patient seen during rounds.   Patient seen on rounds today on inpatient psychiatric unit. Denies current suicidal ideations, homicidal ideations, auditory hallucinations, and visual hallucinations. Reports doing overall okay. Recently initiated on Abilify  to augment existing regimen of Prozac  and Wellbutrin . Patient reports tolerating medication well without noticeable side effects at this time. Objectively, no evidence of medication-related side effects noted on examination including absence of extrapyramidal symptoms, akathisia, or other movement disorders. Patient reports he was previously prescribed amlodipine  for blood pressure control but has been without this medication for some time. Review of flowsheets reveals elevated blood pressure readings while on unit including 129/90, 148/86, and 131/89 among others, indicating inadequate blood pressure control. This provider contacted [facility] which confirmed patient was previously prescribed amlodipine  10 mg daily with last fill date of September 2025 for 90-day supply. Given elevated blood pressure readings and previous prescription history, amlodipine  will be reinitiated at 5 mg daily with plan to titrate further as needed based on blood pressure trends and clinical response. Patient denies associated symptoms including chest pain, shortness of breath, headaches, dizziness, or syncopal episodes. Patient continues to demonstrate depressed affect but remains cooperative with psychiatry assessment and treatment plan.  09/09/2024: Patient seen on rounds today by psychiatry.  Patient reported mood was about the same.  He reported he feels as he always feels which is down.  Patient denied current suicidal or homicidal ideations as well as auditory visual hallucinations but continued to  have depressed affect.  Patient denied any noted side effects from current medication regimen including restlessness, headache, nausea vomiting, dizziness, excessive sedation or stomach discomfort.  Patient endorsed poor sleep however he reported he only gets about 4 to 5 hours nightly.  He reported his appetite is okay.  Patient reported previously had been prescribed Seroquel  for adjunct of treatment of depression as well as to help with sleep.  We discussed the risk, benefits, side effects of switching Abilify  to the Seroquel  to which patient verbalized understanding.  Patient voiced preference and gave informed consent to make the above changes.  Since patient had already taken his Abilify  dose today we will discontinue the Abilify  and begin Seroquel  dosing tomorrow night.  On today's exam, objectively there was no evidence of mania, psychosis, or patient responding to internal stimuli currently.  09/10/2024: On rounds today, patient expressed desire to go home.  However we discussed the need for further monitoring due to recent medication changes as listed above.  Patient verbalized understanding.  Today will be the first day that patient will take Seroquel  at night to target mood, ongoing depression and insomnia.  Per review of chart, it appears patient was given 5 mg Zyprexa  this morning.  Nursing staff reported this was due to anxiety/agitation.  On today's exam, patient denied suicidal or homicidal ideations, as well as auditory or visual hallucinations.  09/11/2024: Patient seen on rounds today by psychiatry.  Patient continued to have flattened, depressed affect, however of note patient reported he was not a bubbly person at baseline.  Patient denied current suicidal or homicidal ideations.  Patient denied auditory or visual hallucinations as well.  Patient reported improvement in sleep last night after taking the Seroquel .  Patient denied any noted side effects from current medication changes.   Objectively, there was no evidence of mania, psychosis, or patient responding to internal stimuli.  Patient continued to voice desire to discharge home.  Patient denied that he had any access to weapons at home.  09/12/24: Patient found in his room on rounds. He endorses frustration with being in the hospital. He denies SI, HI, and AVH.  He has been medication compliant and denies concerns for side effects/issues. He endorses he is not a social person normally and so has not been very active in the milieu. He identifies wanting to go home to his parents as he helps them around the house. Discussed getting collateral from father and he reports he will call then to tell him to answer when SW calls.   1/21: Patient found engaging in dayroom during rounds. Patient reports he is doing okay. He is irritable, but explains he is just ready to go home. He denies SI, HI, and AVH. He reports the increase in seroquel  worked better for him in terms of sleep and overall mood. Denies concerns/complaints. He remains discharge focused.    Past Psychiatric History: see h&P Family History: History reviewed. No pertinent family history. Social History:  Social History   Substance and Sexual Activity  Alcohol Use Yes   Comment: Stopped drinking 01/12/19     Social History   Substance and Sexual Activity  Drug Use Yes   Types: Marijuana    Social History   Socioeconomic History   Marital status: Unknown    Spouse name: Not on file   Number of children: Not on file   Years of education: Not on file   Highest education level: Not on file  Occupational History   Not on file  Tobacco Use   Smoking status: Every Day    Average packs/day: 0.5 packs/day for 40.0 years (20.0 ttl pk-yrs)    Types: Cigarettes    Start date: 08/24/1984   Smokeless tobacco: Never  Substance and Sexual Activity   Alcohol use: Yes    Comment: Stopped drinking 01/12/19   Drug use: Yes    Types: Marijuana   Sexual activity: Not  on file  Other Topics Concern   Not on file  Social History Narrative   Not on file   Social Drivers of Health   Tobacco Use: High Risk (09/11/2024)   Patient History    Smoking Tobacco Use: Every Day    Smokeless Tobacco Use: Never    Passive Exposure: Not on file  Financial Resource Strain: High Risk (08/12/2022)   Received from [facility]   Overall Financial Resource Strain (CARDIA)    Difficulty of Paying Living Expenses: Very hard  Food Insecurity: No Food Insecurity (09/06/2024)   Epic    Worried About Radiation Protection Practitioner of Food in the Last Year: Never true    Ran Out of Food in the Last Year: Never true  Transportation Needs: No Transportation Needs (09/06/2024)   Epic    Lack of Transportation (Medical): No    Lack of Transportation (Non-Medical): No  Physical Activity: Not on file  Stress: Not on file  Social Connections: Not on file  Depression (EYV7-0): Not on file  Alcohol Screen: Medium Risk (09/06/2024)   Alcohol Screen    Last Alcohol Screening Score (AUDIT): 14  Housing: Low Risk (09/06/2024)   Epic    Unable to Pay for Housing in the Last Year: No    Number of Times Moved in the Last Year: 0    Homeless in the Last Year: No  Utilities: Not At Risk (09/06/2024)   Epic  Threatened with loss of utilities: No  Health Literacy: Not on file   Past Medical History:  Past Medical History:  Diagnosis Date   ETOH abuse     Past Surgical History:  Procedure Laterality Date   APPENDECTOMY      Current Medications: Current Facility-Administered Medications  Medication Dose Route Frequency Provider Last Rate Last Admin   acetaminophen  (TYLENOL ) tablet 650 mg  650 mg Oral Q6H PRN Smith, Annie B, NP   650 mg at 09/13/24 0802   alum & mag hydroxide-simeth (MAALOX/MYLANTA) 200-200-20 MG/5ML suspension 30 mL  30 mL Oral Q4H PRN Smith, Annie B, NP       amLODipine  (NORVASC ) tablet 10 mg  10 mg Oral Daily Smith, Annie B, NP   10 mg at 09/13/24 0800   buPROPion   (WELLBUTRIN  XL) 24 hr tablet 150 mg  150 mg Oral Daily Riad Wagley, NP   150 mg at 09/13/24 0800   FLUoxetine  (PROZAC ) capsule 20 mg  20 mg Oral Daily Smith, Annie B, NP   20 mg at 09/13/24 0801   folic acid  (FOLVITE ) tablet 1 mg  1 mg Oral Daily Smith, Annie B, NP   1 mg at 09/13/24 0800   magnesium  hydroxide (MILK OF MAGNESIA) suspension 30 mL  30 mL Oral Daily PRN Smith, Annie B, NP       multivitamin with minerals tablet 1 tablet  1 tablet Oral Daily Smith, Annie B, NP   1 tablet at 09/13/24 0800   OLANZapine  (ZYPREXA ) injection 5 mg  5 mg Intramuscular TID PRN Smith, Annie B, NP       OLANZapine  (ZYPREXA ) tablet 5 mg  5 mg Oral TID PRN Smith, Annie B, NP   5 mg at 09/13/24 9197   pneumococcal 20-valent conjugate vaccine (PREVNAR 20 ) injection 0.5 mL  0.5 mL Intramuscular Tomorrow-1000 Donnelly Mellow, MD       QUEtiapine  (SEROQUEL ) tablet 50 mg  50 mg Oral QHS Nymir Ringler, NP   50 mg at 09/12/24 2107   thiamine  (VITAMIN B1) tablet 100 mg  100 mg Oral Daily Smith, Annie B, NP   100 mg at 09/13/24 0800   Or   thiamine  (VITAMIN B1) injection 100 mg  100 mg Intravenous Daily Smith, Annie B, NP        Lab Results:  No results found for this or any previous visit (from the past 48 hours).   Blood Alcohol level:  Lab Results  Component Value Date   ETH 147 (H) 09/05/2024    Metabolic Disorder Labs: Lab Results  Component Value Date   HGBA1C 5.3 09/08/2024   MPG 105.41 09/08/2024   No results found for: PROLACTIN Lab Results  Component Value Date   CHOL 138 09/08/2024   TRIG 85 09/08/2024   HDL 32 (L) 09/08/2024   CHOLHDL 4.4 09/08/2024   VLDL 17 09/08/2024   LDLCALC 89 09/08/2024    Physical Findings: AIMS:  , ,  ,  ,    CIWA:  CIWA-Ar Total: 2 COWS:      Psychiatric Specialty Exam:  Presentation  General Appearance:  Appropriate for Environment  Eye Contact: Fair  Speech: Clear and Coherent; Normal Rate  Speech Volume: Normal    Mood and Affect   Mood: Euthymic  Affect: Appropriate   Thought Process  Thought Processes: Coherent; Linear  Orientation:Full (Time, Place and Person)  Thought Content:Logical  Hallucinations:Hallucinations: None   Ideas of Reference:None  Suicidal Thoughts:Suicidal Thoughts: No   Homicidal Thoughts:Homicidal  Thoughts: No    Sensorium  Memory: Immediate Fair; Recent Fair  Judgment: Fair  Insight: Fair   Chartered Certified Accountant: Fair  Attention Span: Fair  Recall: [employer] of Knowledge: Fair  Language: Good   Psychomotor Activity  Psychomotor Activity: Psychomotor Activity: Normal   Musculoskeletal: Strength & Muscle Tone: within normal limits Gait & Station: normal Assets  Assets: Housing; Vocational/Educational; Manufacturing Systems Engineer    Physical Exam: Physical Exam Vitals and nursing note reviewed.  Constitutional:      Appearance: Normal appearance.  Pulmonary:     Effort: Pulmonary effort is normal.  Neurological:     Mental Status: He is alert and oriented to person, place, and time.  Psychiatric:        Mood and Affect: Mood normal.        Behavior: Behavior normal.        Thought Content: Thought content normal.    Review of Systems  Respiratory:  Negative for shortness of breath.   Cardiovascular:  Negative for chest pain.  Gastrointestinal:  Negative for diarrhea, nausea and vomiting.  Neurological:  Negative for dizziness, loss of consciousness and headaches.  Psychiatric/Behavioral:  Positive for substance abuse. Negative for depression, hallucinations and suicidal ideas.   All other systems reviewed and are negative.  Blood pressure 126/83, pulse 70, temperature 98 F (36.7 C), temperature source Oral, resp. rate 14, SpO2 98%. There is no height or weight on file to calculate BMI.  Diagnosis: Principal Problem:   MDD (major depressive disorder)   PLAN: Safety and Monitoring:  -- Involuntary admission to  inpatient psychiatric unit for safety, stabilization and treatment  -- Daily contact with patient to assess and evaluate symptoms and progress in treatment  -- Patient's case to be discussed in multi-disciplinary team meeting  -- Observation Level : q15 minute checks  -- Vital signs:  q12 hours  -- Precautions: suicide, elopement, and assault -- Encouraged patient to participate in unit milieu and in scheduled group therapies   2. Psychiatric Treatment:  Scheduled Medications: - Seroquel  50 mg nightly.  Seroquel  being added as adjunct of treatment to help target patient's continued depressive symptoms as well as insomnia. - Wellbutrin  150 ER daily - Fluoxetine  20 mg daily   -- The risks/benefits/side-effects/alternatives to this medication were discussed in detail with the patient and time was given for questions. The patient consents to medication trial.   3. Medical Issues Being Addressed:  - Norvasc  10 mg daily    4. Discharge Planning:   -- Social work and case management to assist with discharge planning and identification of hospital follow-up needs prior to discharge  -- Estimated LOS: 5-7 days  Pending discharge tomorrow.   Glenys Beal, NP

## 2024-09-13 NOTE — Group Note (Signed)
 Date:  09/13/2024 Time:  9:08 PM  Group Topic/Focus:  Wrap-Up Group:   The focus of this group is to help patients review their daily goal of treatment and discuss progress on daily workbooks.    Participation Level:  Active  Participation Quality:  Appropriate  Affect:  Appropriate  Cognitive:  Alert and Appropriate  Insight: Appropriate  Engagement in Group:  Engaged  Modes of Intervention:  Discussion and Support  Additional Comments:    Ginny JONETTA Galeazzi 09/13/2024, 9:08 PM

## 2024-09-13 NOTE — Group Note (Signed)
 Date:  09/13/2024 Time:  1:39 PM  Group Topic/Focus:  Stages of Change:   The focus of this group is to explain the stages of change and help patients identify changes they want to make upon discharge.    Participation Level:  Did Not Attend   Nicholas Durham 09/13/2024, 1:39 PM

## 2024-09-13 NOTE — Progress Notes (Addendum)
 1/ /21/2026 Letter to the Court  Reg: Nicholas Durham DOB: December 01, 1960  Nicholas Durham has been hospitalized since 09/06/2024 at [facility] inpatient psychiatric unit.  Client is a 64 year old male with a history of alcohol use disorder, Generalized anxiety disorder and Major Depressive disorder who was admitted to the inpatient psychiatric unit following acute Suicidal Ideation and Severe Depression . Patient is having poor response to the current medication and we continue to adjust medications.Patient lacks insight into his mental helath crisis and is not safe for discharge as he wants to leave AMA.   As the treatment process is time consuming patient has no safe place to go with out supervision.  We appreciate the court's assistance in stabilizing and currently we request another 15 days of inpatient care to further stabilize.  Sincerely, Zygmunt Mcglinn.MD [facility]

## 2024-09-13 NOTE — Group Note (Signed)
 [facility] LCSW Group Therapy Note   Group Date: 09/13/2024 Start Time: 1300 End Time: 1400   Type of Therapy/Topic:  Group Therapy:  Emotion Regulation  Participation Level:  None   Description of Group:    The purpose of this group is to assist patients in learning to regulate negative emotions and experience positive emotions. Patients will be guided to discuss ways in which they have been vulnerable to their negative emotions. These vulnerabilities will be juxtaposed with experiences of positive emotions or situations, and patients challenged to use positive emotions to combat negative ones. Special emphasis will be placed on coping with negative emotions in conflict situations, and patients will process healthy conflict resolution skills.  Therapeutic Goals: Patient will identify two positive emotions or experiences to reflect on in order to balance out negative emotions:  Patient will label two or more emotions that they find the most difficult to experience:  Patient will be able to demonstrate positive conflict resolution skills through discussion or role plays:   Summary of Patient Progress: Patient was present for the entirety of group. He participated in the icebreaker but not further in the larger discussion. Insight remains questionable.   Therapeutic Modalities:   Cognitive Behavioral Therapy Feelings Identification Dialectical Behavioral Therapy   Nadara JONELLE Fam, LCSW

## 2024-09-13 NOTE — Progress Notes (Signed)
" °   09/13/24 1300  Psych Admission Type (Psych Patients Only)  Admission Status Involuntary  Psychosocial Assessment  Patient Complaints Anxiety  Eye Contact Fair  Facial Expression Flat  Affect Appropriate to circumstance  Speech Logical/coherent  Interaction Minimal  Motor Activity Slow  Appearance/Hygiene In scrubs  Behavior Characteristics Anxious;Irritable  Mood Sad  Aggressive Behavior  Effect No apparent injury  Thought Process  Coherency WDL  Content WDL  Delusions None reported or observed  Perception WDL  Hallucination None reported or observed  Judgment WDL  Confusion WDL  Danger to Self  Current suicidal ideation? Denies  Agreement Not to Harm Self Yes  Description of Agreement verbal  Danger to Others  Danger to Others None reported or observed    "

## 2024-09-13 NOTE — Progress Notes (Signed)
 Pt is currently resting quietly in room. Breathing even and unlabored. Q 15 min checks in place. Will cont to monitor for safety.

## 2024-09-14 ENCOUNTER — Other Ambulatory Visit: Payer: Self-pay

## 2024-09-14 MED ORDER — QUETIAPINE FUMARATE 50 MG PO TABS
50.0000 mg | ORAL_TABLET | Freq: Every day | ORAL | 0 refills | Status: AC
  Filled 2024-09-14: qty 30, 30d supply, fill #0

## 2024-09-14 MED ORDER — AMLODIPINE BESYLATE 10 MG PO TABS
10.0000 mg | ORAL_TABLET | Freq: Every day | ORAL | 0 refills | Status: AC
  Filled 2024-09-14: qty 30, 30d supply, fill #0

## 2024-09-14 MED ORDER — FLUOXETINE HCL 20 MG PO CAPS
20.0000 mg | ORAL_CAPSULE | Freq: Every day | ORAL | 0 refills | Status: AC
  Filled 2024-09-14: qty 30, 30d supply, fill #0

## 2024-09-14 MED ORDER — BUPROPION HCL ER (XL) 150 MG PO TB24
150.0000 mg | ORAL_TABLET | Freq: Every day | ORAL | 0 refills | Status: AC
  Filled 2024-09-14: qty 30, 30d supply, fill #0

## 2024-09-14 NOTE — Progress Notes (Signed)
" °  [facility] Adult Case Management Discharge Plan :  Will you be returning to the same living situation after discharge:  Yes,  pt returning home upon discharge.  At discharge, do you have transportation home?: Yes,  pt provided with taxi voucher.  Do you have the ability to pay for your medications: Yes,  [place] [employer] / [employer]  Release of information consent forms completed and in the chart;  Patient's signature needed at discharge.  Patient to Follow up at:  Follow-up Information     [facility] Follow up.   Why: Return to your group on Friday, 09/15/24 at regular scheduled time. Contact information: [address] [place] [address] [phone]                 Next level of care provider has access to [facility] Link:no  Safety Planning and Suicide Prevention discussed: Yes,  SPE completed with pt father, Avrum Kimball.     Has patient been referred to the Quitline?: Patient refused referral for treatment  Patient has been referred for addiction treatment: Yes, the patient will follow up with an outpatient provider for substance use disorder. Psychiatrist/APP: appointment made and Therapist: appointment made  Nadara JONELLE Fam, LCSW 09/14/2024, 10:23 AM "

## 2024-09-14 NOTE — [facility] Suicide Risk Assessment (Cosign Needed)
 [facility] Discharge Suicide Risk Assessment   Principal Problem: MDD (major depressive disorder) Discharge Diagnoses: Principal Problem:   MDD (major depressive disorder)   Total Time spent with patient: 1 hour  Musculoskeletal: Strength & Muscle Tone: within normal limits Gait & Station: normal Patient leans: N/A  Psychiatric Specialty Exam  Presentation  General Appearance:  Appropriate for Environment  Eye Contact: Fair  Speech: Clear and Coherent; Normal Rate  Speech Volume: Normal  Handedness:No data recorded  Mood and Affect  Mood: Euthymic  Duration of Depression Symptoms: No data recorded Affect: Appropriate   Thought Process  Thought Processes: Coherent; Linear  Descriptions of Associations:Intact  Orientation:Full (Time, Place and Person)  Thought Content:Logical  History of Schizophrenia/Schizoaffective disorder:No data recorded Duration of Psychotic Symptoms:No data recorded Hallucinations:Hallucinations: None  Ideas of Reference:None  Suicidal Thoughts:Suicidal Thoughts: No  Homicidal Thoughts:Homicidal Thoughts: No   Sensorium  Memory: Immediate Fair; Recent Fair  Judgment: Fair  Insight: Fair   Art Therapist  Concentration: Fair  Attention Span: Fair  Recall: Fair  Fund of Knowledge: Fair  Language: Good   Psychomotor Activity  Psychomotor Activity:Psychomotor Activity: Normal   Assets  Assets: Housing; Vocational/Educational; Communication Skills   Sleep  Sleep:Sleep: Good  Estimated Sleeping Duration (Last 24 Hours): 9.75-10.75 hours  Physical Exam: Physical Exam Vitals and nursing note reviewed.  Constitutional:      Appearance: Normal appearance.  Pulmonary:     Effort: Pulmonary effort is normal.  Neurological:     Mental Status: He is alert and oriented to person, place, and time.  Psychiatric:        Mood and Affect: Mood normal.        Behavior: Behavior normal.        Thought  Content: Thought content normal.    Review of Systems  Respiratory:  Negative for shortness of breath.   Cardiovascular:  Negative for chest pain.  Gastrointestinal:  Negative for diarrhea, nausea and vomiting.  Psychiatric/Behavioral:  Positive for substance abuse. Negative for depression, hallucinations and suicidal ideas. The patient is not nervous/anxious.   All other systems reviewed and are negative.  Blood pressure 126/83, pulse 70, temperature 98 F (36.7 C), temperature source Oral, resp. rate 14, SpO2 98%. There is no height or weight on file to calculate BMI.  Mental Status Per Nursing Assessment::   On Admission:  NA  Demographic Factors:  Male, Divorced or widowed, Caucasian, Low socioeconomic status, and Unemployed  Loss Factors: Financial problems/change in socioeconomic status  Historical Factors: Impulsivity  Risk Reduction Factors:   Sense of responsibility to family, Living with another person, especially a relative, and Positive social support  Continued Clinical Symptoms:  Alcohol/Substance Abuse/Dependencies Previous Psychiatric Diagnoses and Treatments  Cognitive Features That Contribute To Risk:  None    Suicide Risk:  Minimal: No identifiable suicidal ideation.  Patients presenting with no risk factors but with morbid ruminations; Marisel Tostenson be classified as minimal risk based on the severity of the depressive symptoms   Follow-up Information     [facility] Follow up.   Why: Return to your normal scheduled group day. Contact information: [address] [place] [address] [phone]                 Plan Of Care/Follow-up recommendations:  Follow up with current outpatient providers  Allan Minotti, NP 09/14/2024, 12:36 AM

## 2024-09-14 NOTE — BH IP Treatment Plan (Signed)
 Interdisciplinary Treatment and Diagnostic Plan Update  09/14/2024 Time of Session: 8:00 Nicholas Durham MRN: [id number]  Principal Diagnosis: MDD (major depressive disorder)  Secondary Diagnoses: Principal Problem:   MDD (major depressive disorder)   Current Medications:  Current Facility-Administered Medications  Medication Dose Route Frequency Provider Last Rate Last Admin   acetaminophen  (TYLENOL ) tablet 650 mg  650 mg Oral Q6H PRN Smith, Annie B, NP   650 mg at 09/14/24 0804   alum & mag hydroxide-simeth (MAALOX/MYLANTA) 200-200-20 MG/5ML suspension 30 mL  30 mL Oral Q4H PRN Smith, Annie B, NP       amLODipine  (NORVASC ) tablet 10 mg  10 mg Oral Daily Smith, Annie B, NP   10 mg at 09/14/24 9195   buPROPion  (WELLBUTRIN  XL) 24 hr tablet 150 mg  150 mg Oral Daily May, Tanya, NP   150 mg at 09/14/24 9195   FLUoxetine  (PROZAC ) capsule 20 mg  20 mg Oral Daily Smith, Annie B, NP   20 mg at 09/14/24 0804   folic acid  (FOLVITE ) tablet 1 mg  1 mg Oral Daily Smith, Annie B, NP   1 mg at 09/14/24 9195   magnesium  hydroxide (MILK OF MAGNESIA) suspension 30 mL  30 mL Oral Daily PRN Smith, Annie B, NP       multivitamin with minerals tablet 1 tablet  1 tablet Oral Daily Smith, Annie B, NP   1 tablet at 09/14/24 9195   OLANZapine  (ZYPREXA ) injection 5 mg  5 mg Intramuscular TID PRN Smith, Annie B, NP       OLANZapine  (ZYPREXA ) tablet 5 mg  5 mg Oral TID PRN Smith, Annie B, NP   5 mg at 09/13/24 9197   pneumococcal 20-valent conjugate vaccine (PREVNAR 20 ) injection 0.5 mL  0.5 mL Intramuscular Tomorrow-1000 Jadapalle, Sree, MD       QUEtiapine  (SEROQUEL ) tablet 50 mg  50 mg Oral QHS May, Tanya, NP   50 mg at 09/13/24 2047   thiamine  (VITAMIN B1) tablet 100 mg  100 mg Oral Daily Smith, Annie B, NP   100 mg at 09/14/24 9195   Or   thiamine  (VITAMIN B1) injection 100 mg  100 mg Intravenous Daily Smith, Annie B, NP       PTA Medications: Medications Prior to Admission  Medication Sig Dispense Refill  Last Dose/Taking   folic acid  (FOLVITE ) 1 MG tablet Take 1 tablet (1 mg total) by mouth daily. 30 tablet 0    hydrOXYzine  (ATARAX ) 25 MG tablet Take 25 mg by mouth 3 (three) times daily.      Ibuprofen-diphenhydrAMINE HCl 200-25 MG CAPS Take 1-2 capsules by mouth at bedtime as needed (sleep).      Multiple Vitamin (MULTIVITAMIN WITH MINERALS) TABS tablet Take 1 tablet by mouth daily. 90 tablet 0    nicotine  (NICODERM CQ  - DOSED IN MG/24 HOURS) 21 mg/24hr patch Place 1 patch (21 mg total) onto the skin daily. 28 patch 0    thiamine  100 MG tablet Take 1 tablet (100 mg total) by mouth daily. 30 tablet 0     Patient Stressors:    Patient Strengths:    Treatment Modalities: Medication Management, Group therapy, Case management,  1 to 1 session with clinician, Psychoeducation, Recreational therapy.   Physician Treatment Plan for Primary Diagnosis: MDD (major depressive disorder) Long Term Goal(s): Improvement in symptoms so as ready for discharge   Short Term Goals: Ability to identify changes in lifestyle to reduce recurrence of condition will improve Ability to disclose and  discuss suicidal ideas Ability to identify and develop effective coping behaviors will improve Ability to maintain clinical measurements within normal limits will improve Compliance with prescribed medications will improve Ability to identify triggers associated with substance abuse/mental health issues will improve  Medication Management: Evaluate patient's response, side effects, and tolerance of medication regimen.  Therapeutic Interventions: 1 to 1 sessions, Unit Group sessions and Medication administration.  Evaluation of Outcomes: Adequate for Discharge  Physician Treatment Plan for Secondary Diagnosis: Principal Problem:   MDD (major depressive disorder)  Long Term Goal(s): Improvement in symptoms so as ready for discharge   Short Term Goals: Ability to identify changes in lifestyle to reduce recurrence of  condition will improve Ability to disclose and discuss suicidal ideas Ability to identify and develop effective coping behaviors will improve Ability to maintain clinical measurements within normal limits will improve Compliance with prescribed medications will improve Ability to identify triggers associated with substance abuse/mental health issues will improve     Medication Management: Evaluate patient's response, side effects, and tolerance of medication regimen.  Therapeutic Interventions: 1 to 1 sessions, Unit Group sessions and Medication administration.  Evaluation of Outcomes: Adequate for Discharge   RN Treatment Plan for Primary Diagnosis: MDD (major depressive disorder) Long Term Goal(s): Knowledge of disease and therapeutic regimen to maintain health will improve  Short Term Goals: Ability to remain free from injury will improve, Ability to verbalize frustration and anger appropriately will improve, Ability to demonstrate self-control, Ability to participate in decision making will improve, Ability to verbalize feelings will improve, Ability to disclose and discuss suicidal ideas, Ability to identify and develop effective coping behaviors will improve, and Compliance with prescribed medications will improve  Medication Management: RN will administer medications as ordered by provider, will assess and evaluate patient's response and provide education to patient for prescribed medication. RN will report any adverse and/or side effects to prescribing provider.  Therapeutic Interventions: 1 on 1 counseling sessions, Psychoeducation, Medication administration, Evaluate responses to treatment, Monitor vital signs and CBGs as ordered, Perform/monitor CIWA, COWS, AIMS and Fall Risk screenings as ordered, Perform wound care treatments as ordered.  Evaluation of Outcomes: Adequate for Discharge   LCSW Treatment Plan for Primary Diagnosis: MDD (major depressive disorder) Long Term  Goal(s): Safe transition to appropriate next level of care at discharge, Engage patient in therapeutic group addressing interpersonal concerns.  Short Term Goals: Engage patient in aftercare planning with referrals and resources, Increase social support, Increase ability to appropriately verbalize feelings, Increase emotional regulation, Facilitate acceptance of mental health diagnosis and concerns, Facilitate patient progression through stages of change regarding substance use diagnoses and concerns, Identify triggers associated with mental health/substance abuse issues, and Increase skills for wellness and recovery  Therapeutic Interventions: Assess for all discharge needs, 1 to 1 time with Social worker, Explore available resources and support systems, Assess for adequacy in community support network, Educate family and significant other(s) on suicide prevention, Complete Psychosocial Assessment, Interpersonal group therapy.  Evaluation of Outcomes: Adequate for Discharge   Progress in Treatment: Attending groups: Yes. Participating in groups: Yes. Taking medication as prescribed: Yes. Toleration medication: Yes. Family/Significant other contact made: Yes, individual(s) contacted:  father, Dawsyn Ramsaran.  Patient understands diagnosis: Yes. Discussing patient identified problems/goals with staff: Yes. Medical problems stabilized or resolved: Yes. Denies suicidal/homicidal ideation: Yes. Issues/concerns per patient self-inventory: No. Other: none.  New problem(s) identified: No, Describe:  None. 09/14/24 Update: No changes at this time.    New Short Term/Long Term Goal(s): detox, elimination  of symptoms of psychosis, medication management for mood stabilization; elimination of SI thoughts; development of comprehensive mental wellness/sobriety plan. 09/14/24 Update: No changes at this time.    Patient Goals:  Whenever you all can let me go is my goal. 09/14/24 Update: No changes at this  time.   Discharge Plan or Barriers: CSW to assist with the development of appropriate discharge plan. 09/14/24 Update: Patient discharge set for today. He will be returning home with outpatient services through [facility].     Reason for Continuation of Hospitalization: Aggression Anxiety Depression Suicidal ideation Withdrawal symptoms   Estimated Length of Stay: 1-7 days. 09/14/24 Update: Discharge today.   Last 3 [place] Suicide Severity Risk Score: Flowsheet Row Admission (Current) from 09/06/2024 in [facility] INPATIENT BEHAV[facility]AL MEDICINE ED from 09/05/2024 in [facility] Emergency Department at [facility]  C-SSRS RISK CATEGORY No Risk Moderate Risk    Last PHQ 2/9 Scores:     No data to display          Scribe for Treatment Team: Nadara JONELLE Fam, LCSW 09/14/2024 8:22 AM

## 2024-09-14 NOTE — Discharge Summary (Signed)
 " Physician Discharge Summary Note  Patient:  Nicholas Durham is an 64 y.o., adult MRN:  [id number] DOB:  11/24/1960 Patient phone:  [phone] (home)  Patient address:   [address] [place] [address],   Total time spent: 40 min Date of Admission:  09/06/2024 Date of Discharge: 09/14/2024  Reason for Admission: Suicidal ideation  Principal Problem: MDD (major depressive disorder) Discharge Diagnoses: Principal Problem:   MDD (major depressive disorder)   Past Psychiatric History: see h&p  Family Psychiatric  History: see h&p Social History:  Social History   Substance and Sexual Activity  Alcohol Use Yes   Comment: Stopped drinking 01/12/19     Social History   Substance and Sexual Activity  Drug Use Yes   Types: Marijuana    Social History   Socioeconomic History   Marital status: Unknown    Spouse name: Not on file   Number of children: Not on file   Years of education: Not on file   Highest education level: Not on file  Occupational History   Not on file  Tobacco Use   Smoking status: Every Day    Average packs/day: 0.5 packs/day for 40.0 years (20.0 ttl pk-yrs)    Types: Cigarettes    Start date: 08/24/1984   Smokeless tobacco: Never  Substance and Sexual Activity   Alcohol use: Yes    Comment: Stopped drinking 01/12/19   Drug use: Yes    Types: Marijuana   Sexual activity: Not on file  Other Topics Concern   Not on file  Social History Narrative   Not on file   Social Drivers of Health   Tobacco Use: High Risk (09/11/2024)   Patient History    Smoking Tobacco Use: Every Day    Smokeless Tobacco Use: Never    Passive Exposure: Not on file  Financial Resource Strain: High Risk (08/12/2022)   Received from [facility]   Overall Financial Resource Strain (CARDIA)    Difficulty of Paying Living Expenses: Very hard  Food Insecurity: No Food Insecurity (09/06/2024)   Epic    Worried About Programme Researcher, Broadcasting/film/video in the Last Year: Never  true    Ran Out of Food in the Last Year: Never true  Transportation Needs: No Transportation Needs (09/06/2024)   Epic    Lack of Transportation (Medical): No    Lack of Transportation (Non-Medical): No  Physical Activity: Not on file  Stress: Not on file  Social Connections: Not on file  Depression (EYV7-0): Not on file  Alcohol Screen: Medium Risk (09/06/2024)   Alcohol Screen    Last Alcohol Screening Score (AUDIT): 14  Housing: Low Risk (09/06/2024)   Epic    Unable to Pay for Housing in the Last Year: No    Number of Times Moved in the Last Year: 0    Homeless in the Last Year: No  Utilities: Not At Risk (09/06/2024)   Epic    Threatened with loss of utilities: No  Health Literacy: Not on file   Past Medical History:  Past Medical History:  Diagnosis Date   ETOH abuse     Past Surgical History:  Procedure Laterality Date   APPENDECTOMY     Family History: History reviewed. No pertinent family history.  Hospital Course:  The patient is a 64 year old male with a history of alcohol use disorder, GAD and MDD who was admitted to the inpatient psychiatric unit following Acute Suicidal Ideation and Severe Depression. The patient  presented with worsening psychiatric symptoms in the setting of relapse on alcohol and legal concerns.   On admission, patient was dysphoric with minimal eye contact.  He was engaged but remained guarded.  He denied SI HI and AVH.  Patient reported that he had relapsed on alcohol after being triggered by his father needing to come into the hospital.  He endorses a history of several DUIs and is not allowed to drink at all as per his parole.  He reported engaging in SA IOP 3 times a week to maintain his sobriety.  Patient was continued on his Wellbutrin  and Prozac  home doses.  He was initiated on Abilify  2 mg for augmentation.  Patient was then switched to Seroquel  due to preference.  He was eventually maintained on Wellbutrin  150 mg ER daily, fluoxetine  20 mg  daily, Seroquel  50 mg nightly.  He was minimally engaged in groups, but reported that he is not a very social person.  He was medication compliant throughout admission.  Detailed risk assessment is complete based on clinical exam and individual risk factors and acute suicide risk is low and acute violence risk is low.    On the day of discharge, patient denies SI/HI/plan and denies hallucinations.  Patient remains future oriented and is willing to participate in outpatient mental health services.  Currently, all modifiable risk of harm to self/harm to others have been addressed and patient is no longer appropriate for the acute inpatient setting and is able to continue treatment for mental health needs in the community with the supports as indicated below.  Patient is educated and verbalized understanding of discharge plan of care including medications, follow-up appointments, mental health resources and further crisis services in the community.  He is instructed to call 911 or present to the nearest emergency room should he experience any decompensation in mood, disturbance of bowel or return of suicidal/homicidal ideations.  Patient verbalizes understanding of this education and agrees to this plan of care  Physical Findings: AIMS:  , ,  ,  ,    CIWA:  CIWA-Ar Total: 2 COWS:      Psychiatric Specialty Exam:  Presentation  General Appearance:  Appropriate for Environment; Casual  Eye Contact: Fair  Speech: Clear and Coherent; Normal Rate  Speech Volume: Normal    Mood and Affect  Mood: Euthymic  Affect: Appropriate; Congruent   Thought Process  Thought Processes: Coherent; Goal Directed; Linear  Descriptions of Associations:Intact  Orientation:Full (Time, Place and Person)  Thought Content:Logical  Hallucinations:Hallucinations: None  Ideas of Reference:None  Suicidal Thoughts:Suicidal Thoughts: No  Homicidal Thoughts:Homicidal Thoughts: No   Sensorium   Memory: Immediate Fair; Recent Fair  Judgment: Fair  Insight: Fair   Art Therapist  Concentration: Fair  Attention Span: Fair  Recall: Fair  Fund of Knowledge: Fair  Language: Good   Psychomotor Activity  Psychomotor Activity: Psychomotor Activity: Normal  Musculoskeletal: Strength & Muscle Tone: within normal limits Gait & Station: normal Assets  Assets: Manufacturing Systems Engineer; Housing; Vocational/Educational; Social Support   Sleep  Sleep: Sleep: Good    Physical Exam: Physical Exam Vitals and nursing note reviewed.  Constitutional:      Appearance: Normal appearance.  Pulmonary:     Effort: Pulmonary effort is normal.  Neurological:     General: No focal deficit present.     Mental Status: He is alert and oriented to person, place, and time. Mental status is at baseline.  Psychiatric:        Mood and Affect:  Mood normal.        Behavior: Behavior normal.        Thought Content: Thought content normal.    Review of Systems  Respiratory:  Negative for shortness of breath.   Cardiovascular:  Negative for chest pain.  Gastrointestinal:  Negative for diarrhea, nausea and vomiting.  Psychiatric/Behavioral:  Negative for depression, hallucinations, substance abuse and suicidal ideas. The patient is not nervous/anxious.   All other systems reviewed and are negative.  Blood pressure 126/83, pulse 70, temperature 98 F (36.7 C), temperature source Oral, resp. rate 14, SpO2 98%. There is no height or weight on file to calculate BMI.   Tobacco Use History[1] Tobacco Cessation:  A prescription for an FDA-approved tobacco cessation medication was offered at discharge and the patient refused   Blood Alcohol level:  Lab Results  Component Value Date   ETH 147 (H) 09/05/2024    Metabolic Disorder Labs:  Lab Results  Component Value Date   HGBA1C 5.3 09/08/2024   MPG 105.41 09/08/2024   No results found for: PROLACTIN Lab Results   Component Value Date   CHOL 138 09/08/2024   TRIG 85 09/08/2024   HDL 32 (L) 09/08/2024   CHOLHDL 4.4 09/08/2024   VLDL 17 09/08/2024   LDLCALC 89 09/08/2024    See Psychiatric Specialty Exam and Suicide Risk Assessment completed by Attending Physician prior to discharge.  Discharge destination:  Home  Is patient on multiple antipsychotic therapies at discharge:  No   Has Patient had three or more failed trials of antipsychotic monotherapy by history:  No  Recommended Plan for Multiple Antipsychotic Therapies: NA  Discharge Instructions     Increase activity slowly   Complete by: As directed       Allergies as of 09/14/2024   No Known Allergies      Medication List     TAKE these medications      Indication  amLODipine  10 MG tablet Commonly known as: NORVASC  Take 1 tablet (10 mg total) by mouth daily.  Indication: High Blood Pressure   buPROPion  150 MG 24 hr tablet Commonly known as: WELLBUTRIN  XL Take 1 tablet (150 mg total) by mouth daily.  Indication: Major Depressive Disorder   FLUoxetine  20 MG capsule Commonly known as: PROZAC  Take 1 capsule (20 mg total) by mouth daily.  Indication: Major Depressive Disorder   folic acid  1 MG tablet Commonly known as: FOLVITE  Take 1 tablet (1 mg total) by mouth daily.  Indication: Anemia From Inadequate Folic Acid    hydrOXYzine  25 MG tablet Commonly known as: ATARAX  Take 25 mg by mouth 3 (three) times daily.  Indication: Feeling Anxious   Ibuprofen-diphenhydrAMINE HCl 200-25 MG Caps Take 1-2 capsules by mouth at bedtime as needed (sleep).  Indication: Trouble Sleeping   multivitamin with minerals Tabs tablet Take 1 tablet by mouth daily.  Indication: supplementation   nicotine  21 mg/24hr patch Commonly known as: NICODERM CQ  - dosed in mg/24 hours Place 1 patch (21 mg total) onto the skin daily.  Indication: Nicotine  Addiction   QUEtiapine  50 MG tablet Commonly known as: SEROQUEL  Take 1 tablet (50 mg  total) by mouth at bedtime.  Indication: Major Depressive Disorder   thiamine  100 MG tablet Commonly known as: VITAMIN B1 Take 1 tablet (100 mg total) by mouth daily.  Indication: Deficiency of Vitamin B1        Follow-up Information     [facility] Follow up.   Why: Return to your normal  scheduled group day. Contact information: [address] [place] [address] [phone]                 Follow-up recommendations:   Follow up with current outpatient providers.   Signed: Niya Behler, NP 09/14/2024, 12:41 AM            [1]  Social History Tobacco Use  Smoking Status Every Day   Average packs/day: 0.5 packs/day for 40.0 years (20.0 ttl pk-yrs)   Types: Cigarettes   Start date: 08/24/1984  Smokeless Tobacco Never   "

## 2024-09-14 NOTE — Group Note (Signed)
 Date:  09/14/2024 Time:  9:56 AM  Group Topic/Focus:  Emotional Education:   The focus of this group is to discuss what feelings/emotions are, and how they are experienced.    Participation Level:  Did Not Attend    Skippy LITTIE Bennett 09/14/2024, 9:56 AM

## 2024-09-14 NOTE — Progress Notes (Signed)
 Patient discharged home via [facility]. Given all belongings. He verbalized understanding of discharge plan and hoe to pick up medications.
